# Patient Record
Sex: Female | Born: 1935 | Race: White | Hispanic: No | Smoking: Former smoker
Health system: Southern US, Community
[De-identification: ages and names within clinical notes are randomized; demographics above are authoritative.]

## PROBLEM LIST (undated history)

## (undated) DIAGNOSIS — I10 Essential (primary) hypertension: Secondary | ICD-10-CM

## (undated) DIAGNOSIS — C259 Malignant neoplasm of pancreas, unspecified: Secondary | ICD-10-CM

## (undated) DIAGNOSIS — K219 Gastro-esophageal reflux disease without esophagitis: Secondary | ICD-10-CM

## (undated) DIAGNOSIS — F419 Anxiety disorder, unspecified: Secondary | ICD-10-CM

## (undated) DIAGNOSIS — Z9181 History of falling: Secondary | ICD-10-CM

## (undated) DIAGNOSIS — C801 Malignant (primary) neoplasm, unspecified: Secondary | ICD-10-CM

## (undated) DIAGNOSIS — K900 Celiac disease: Secondary | ICD-10-CM

## (undated) DIAGNOSIS — I82409 Acute embolism and thrombosis of unspecified deep veins of unspecified lower extremity: Secondary | ICD-10-CM

## (undated) HISTORY — DX: Acute embolism and thrombosis of unspecified deep veins of unspecified lower extremity: I82.409

## (undated) HISTORY — PX: INTERSTIM IMPLANT PLACEMENT: SHX5130

## (undated) HISTORY — DX: History of falling: Z91.81

## (undated) HISTORY — DX: Celiac disease: K90.0

## (undated) HISTORY — PX: TUBAL LIGATION: SHX77

## (undated) HISTORY — PX: BACK SURGERY: SHX140

## (undated) HISTORY — PX: OTHER SURGICAL HISTORY: SHX169

## (undated) HISTORY — PX: OVARIAN CYST REMOVAL: SHX89

---

## 1998-04-25 ENCOUNTER — Encounter: Admission: RE | Admit: 1998-04-25 | Discharge: 1998-07-24 | Payer: Self-pay | Admitting: Anesthesiology

## 2000-09-18 ENCOUNTER — Encounter: Admission: RE | Admit: 2000-09-18 | Discharge: 2000-09-18 | Payer: Self-pay | Admitting: Infectious Diseases

## 2000-10-14 ENCOUNTER — Encounter: Admission: RE | Admit: 2000-10-14 | Discharge: 2000-10-14 | Payer: Self-pay | Admitting: Infectious Diseases

## 2002-11-24 ENCOUNTER — Ambulatory Visit (HOSPITAL_BASED_OUTPATIENT_CLINIC_OR_DEPARTMENT_OTHER): Admission: RE | Admit: 2002-11-24 | Discharge: 2002-11-24 | Payer: Self-pay | Admitting: Urology

## 2002-11-24 ENCOUNTER — Encounter: Payer: Self-pay | Admitting: Urology

## 2002-12-04 ENCOUNTER — Ambulatory Visit (HOSPITAL_BASED_OUTPATIENT_CLINIC_OR_DEPARTMENT_OTHER): Admission: RE | Admit: 2002-12-04 | Discharge: 2002-12-04 | Payer: Self-pay | Admitting: Urology

## 2008-08-18 ENCOUNTER — Encounter: Admission: RE | Admit: 2008-08-18 | Discharge: 2008-08-18 | Payer: Self-pay | Admitting: Internal Medicine

## 2010-05-14 ENCOUNTER — Encounter: Payer: Self-pay | Admitting: Internal Medicine

## 2010-06-19 ENCOUNTER — Ambulatory Visit (INDEPENDENT_AMBULATORY_CARE_PROVIDER_SITE_OTHER): Payer: Medicare Other | Admitting: Internal Medicine

## 2010-06-19 DIAGNOSIS — R1310 Dysphagia, unspecified: Secondary | ICD-10-CM

## 2010-06-19 DIAGNOSIS — K219 Gastro-esophageal reflux disease without esophagitis: Secondary | ICD-10-CM

## 2010-06-28 ENCOUNTER — Ambulatory Visit (HOSPITAL_COMMUNITY)
Admission: RE | Admit: 2010-06-28 | Discharge: 2010-06-28 | Disposition: A | Discharge status: Home / Self Care | Payer: Medicare Other | Source: Ambulatory Visit | Attending: Internal Medicine | Admitting: Internal Medicine

## 2010-06-28 ENCOUNTER — Encounter (HOSPITAL_BASED_OUTPATIENT_CLINIC_OR_DEPARTMENT_OTHER): Payer: Medicare Other | Admitting: Internal Medicine

## 2010-06-28 ENCOUNTER — Other Ambulatory Visit (INDEPENDENT_AMBULATORY_CARE_PROVIDER_SITE_OTHER): Payer: Self-pay | Admitting: Internal Medicine

## 2010-06-28 DIAGNOSIS — R1310 Dysphagia, unspecified: Secondary | ICD-10-CM | POA: Insufficient documentation

## 2010-06-28 DIAGNOSIS — K900 Celiac disease: Secondary | ICD-10-CM | POA: Insufficient documentation

## 2010-06-28 DIAGNOSIS — K219 Gastro-esophageal reflux disease without esophagitis: Secondary | ICD-10-CM

## 2010-06-28 DIAGNOSIS — K319 Disease of stomach and duodenum, unspecified: Secondary | ICD-10-CM

## 2010-07-24 NOTE — Op Note (Signed)
  Taylor Alvarez, Taylor Alvarez               ACCOUNT NO.:  [id number]  MEDICAL RECORD NO.:  [id number]           PATIENT TYPE:  O  LOCATION:  DAYP                          FACILITY:  [facility]  PHYSICIAN:  Lionel December, M.D.    DATE OF BIRTH:  [birth year]  DATE OF PROCEDURE:  06/28/2010 DATE OF DISCHARGE:                              OPERATIVE REPORT   PROCEDURE:  Esophagogastroduodenoscopy with esophageal dilation.  INDICATION:  [name] is 77 year old Caucasian female who presents with history of solid food dysphagia, experienced over the last 6 months. She has had few episodes of food impaction relieved with regurgitation. Her last esophageal dilation was 3-1/2 years ago.  Even though no abnormalities were noted to esophagus, she responded to [facility] dilation.  Procedure risks were reviewed with the patient.  Informed consent was obtained.  MEDS FOR CONSCIOUS SEDATION:  Cetacaine spray for oropharyngeal topical anesthesia, Demerol 50 mg IV, Versed 4 mg IV.  FINDINGS:  Procedure performed in endoscopy suite.  The patient's vital signs and O2 sat were monitored during the procedure and remained stable.  The patient was placed in left lateral recumbent position and Pentax videoscope was passed through oropharynx without any difficulty into esophagus.  Esophagus.  Mucosa of the esophagus was normal throughout.  GE junction was located at 36 cm from the incisors and was unremarkable.  No hernia was noted.  Stomach.  It was empty and distended very well by insufflation.  Folds of proximal stomach are normal.  Examination of mucosa at body, antrum, pyloric channel, as well as angularis, fundus, and cardia was normal. Pyloric channel was patent.  Duodenum.  Bulbar mucosa was normal.  Scope was passed into second part of duodenum in the proximal segment.  Lumen was somewhat dilated and there was few small plaques or patches of slightly raised mucosa with abnormal appearance.  This was biopsied  towards the end of the procedure.  Endoscope was withdrawn.  Esophagus was dilated by passing 54-French Maloney dilator to full insertion.  As the dilator was withdrawn, endoscope was passed again and mucosa reexamined and no disruption noted.  Endoscope was passed again to second part of the duodenum and biopsy was taken from these patches of abnormal-appearing postbulbar mucosa. Endoscope was withdrawn.  The patient tolerated the procedure well.  FINAL DIAGNOSES: 1. Normal esophageal exam. 2. Esophagus dilated by passing 54-French Maloney dilator given     history of dysphagia and no mucosal disruption induced. 3. Few small patches of slightly raised mucosa involving the second     part of the duodenum with abnormal appearance.  Biopsy taken for     histology.  RECOMMENDATIONS:  Standard instructions given.  The patient will resume her usual meds.  I will be contacting with results of biopsy and further recommendations if any.     Lionel December, M.D.     NR/MEDQ  D:  06/28/2010  T:  06/28/2010  Job:  [id number]  cc:   Dr. Sherril Croon  Electronically Signed by Lionel December M.D. on 07/24/2010 09:48:05 AM

## 2010-07-24 NOTE — Consult Note (Signed)
NAME:  Taylor Alvarez, Taylor Alvarez               ACCOUNT NO.:  [id number]  MEDICAL RECORD NO.:                     PATIENT TYPE:  LOCATION:                                 FACILITY:  PHYSICIAN:  Lionel December, M.D.    DATE OF BIRTH:  1931-12-26  DATE OF CONSULTATION:  06/19/2010 DATE OF DISCHARGE:                                CONSULTATION   PRESENTING COMPLAINT:  Solid food dysphagia of 6 months' duration.  HISTORY OF PRESENT ILLNESS:  Taylor Alvarez is a 77 year old Caucasian female, patient of Dr. Sherril Croon, who presents with 41-month history of intermittent solid food dysphagia.  She has history of dysphagia.  She had her esophagus dilated back in October 2008.  Even though no structural abnormality was noted to her esophagus, she responded to dilation until about 6 months ago.  She points to suprasternal area as site of bolus obstruction.  She has more trouble with rice, chicken, and breads.  She has had few episodes of food impaction relieved with regurgitation but more often she is able to pass the food bolus distally.  She rarely experiences heartburn while on therapy.  She has a good appetite.  She denies weight loss, melena, or rectal bleeding.  She also denies chronic cough, sore throat, or hoarseness.  She states she has been treated for urinary tract infection multiple times but now on a maintenance therapy with Macrodantin.  CURRENT MEDICATIONS: 1. Vasotec 5 mg p.o. daily. 2. Zoloft 50 mg p.o. daily. 3. Prilosec 20 mg p.o. q.a.m. 4. Centrum Silver 1 daily. 5. Calcium with D 600 daily. 6. Stool softener 1-2 daily p.r.n. 7. MOM p.r.n. which is occasional. 8. Tylenol PM p.r.n. 9. Vitamin D 1000 units daily. 10.Neurontin 300 mg p.o. daily. 11.Imipramine 25 mg daily to start this week. 12.Macrobid 100 mg p.o. at bedtime.  PAST MEDICAL HISTORY:  Chronic GERD of about 13 years' duration.  She had EGD and ED in October 2008 responded to esophageal dilation.  She had small hyperplastic  polyp removed from her gastric body.  Her second dilation was in September 2009 when she also had esophageal biopsy which was negative for eosinophilic esophagitis.  She has been hypertensive for more than 10 years.  She has a history of stress disorder, well controlled with therapy.  The patient's last colonoscopy was in August 2007 revealing mild sigmoid diverticulosis.  She had C-section in 1963, tubal ligation in 1970.  She had lumbar spine surgery for disk disease in 2001 and in January 2008, she had hysterectomy with pelvic floor reconstruction done at [facility].  She has had bilateral cataract surgery, left one was about 6 years ago and right one was done last year.  In 2010, she was also evaluated by speech pathologist and her oropharyngeal deglutition was normal.  History of posthepatic neuralgia and some hearing impairment.  She is on Neurontin for this.  ALLERGIES:  KEFLEX and CARDIZEM.  FAMILY HISTORY:  Mother lived to be 88.  Father died of MI at age 13, he was a physician.  One sister treated for breast carcinoma and is  in remission.  SOCIAL HISTORY:  She is divorced.  She has 3 grown up children.  She is a retired Chartered loss adjuster.  She smoked less than a pack a day for 10 years but quit many years ago.  She drinks wine occasionally.  OBJECTIVE:  VITAL SIGNS:  Weight 156 pounds, she 63 inches tall, pulse 72 per minute, blood pressure 110/68, and temperature is 97.6. HEENT:  Conjunctivae are pink.  Sclerae are nonicteric.  Oropharyngeal mucosa is normal.  Dentition in satisfactory condition. NECK:  No neck masses or thyromegaly noted. CARDIAC:  Regular rhythm.  Normal S1 and S2.  No murmur, rub, or gallop noted. LUNGS:  Clear to auscultation. ABDOMEN:  Symmetrical, soft, and nontender with no organomegaly or masses. EXTREMITIES:  No peripheral edema or clubbing noted.  ASSESSMENT:  Taylor Alvarez is a 77 year old Caucasian female who has chronic gastroesophageal reflux disease  and symptoms are well controlled with antireflux meds and PPI who presents with intermittent solid food dysphagia.  She has had her esophagus dilated twice in the past and on both occasions she responded to therapy.  She has never undergone esophageal manometry or other studies.  Since she has responded well to dilation, we will directly proceed with esophagogastroduodenoscopy and esophageal dilation.  RECOMMENDATIONS:  Esophagogastroduodenoscopy with esophageal dilation. I have reviewed the procedure, risks, and sent options with the patient and she is agreeable to proceed with it.  We appreciate the opportunity to participate in the care of this nice lady.     Lionel December, M.D.     NR/MEDQ  D:  06/19/2010  T:  06/19/2010  Job:  [id number]  cc:   Dr. Sherril Croon  Electronically Signed by Lionel December M.D. on 07/24/2010 09:47:48 AM

## 2010-09-08 NOTE — Op Note (Signed)
NAME:  Taylor Alvarez, Taylor Alvarez                         ACCOUNT NO.:  [id number]   MEDICAL RECORD NO.:  [id number]                   PATIENT TYPE:  AMB   LOCATION:  [facility]                                 FACILITY:  [facility]   PHYSICIAN:  Jamison Neighbor, M.D.               DATE OF BIRTH:  Jul 25, 1931   DATE OF PROCEDURE:  12/04/2002  DATE OF DISCHARGE:                                 OPERATIVE REPORT   PREOPERATIVE DIAGNOSIS:  Infected first-stage InterStim implant.   POSTOPERATIVE DIAGNOSIS:  Infected first-stage InterStim implant.   PROCEDURE:  Removal of first stage InterStim implant.   SURGEON:  Jamison Neighbor, M.D.   ANESTHESIA:  General.   COMPLICATIONS:  None.   DRAIN:  Iodoform gauze.   HISTORY:  This 77 year old female suffers from urgency and frequency  syndrome, unresponsive to anticholinergic therapy.  The patient was tested  with an InterStim implant and had an excellent response to therapy.  The  patient noticed a change in her voiding pattern.  She no longer had any  incontinence, and she felt that she was emptying more efficiently.  The  patient was scheduled to undergo a second-stage implant.  The patient  developed cellulitis in that area within the past 48 hours.  This area was  checked on Wednesday by the parish nurse in her congregation, and it was  felt to be normal.  But by this morning, it was red and beefy.  On  examination, there was clearly cellulitis around the lateral puncture hole  where the connection between the external lead extender and the quadripolar  lead was made.  The patient was advised that this would need to be removed;  the area would have to heal, and then she could eventually go on to have  both first and second-stage implant at the same time since it has been  demonstrated that the patient does respond to this therapy.  The patient  understands the risks and benefits of the procedure and gave full and  informed consent.   DESCRIPTION OF  PROCEDURE:  After the successful induction of general  anesthesia, the patient was placed in the lateral position in order to  prevent her from having to undergo intubation.  She was then prepped and  draped in the usual fashion.  The sutures were taken out of the two  incisions.  The lateral incision was opened, and pus came out.  This was  irrigated out, the connector was then elevated with a right-angle clamp.  The water had been cut off at the skin level, and this easily was pulled  out.  The quadripolar lead itself was easily removed.  There was no need to  open up the paramedian incision, as that area showed no evidence of  cellulitis.  The incision in the pocket inside were irrigated with  antibiotic solution, and then the patient had an Iodoform pack placed.  The  wound was left open.  The patient tolerated the procedure well and was taken  to the recovery room in good condition.  She had taken Cipro postoperatively  because she is allergic to CEPHALOSPORINS but now will be switched to  doxycycline as an  alternative therapy since clearly quinolones did not work well for her.  The  patient will return to the office in follow-up.  She has been told that in  three months if the wounds are all healed, we can place the first and second  stage simultaneously.                                               Jamison Neighbor, M.D.    RJE/MEDQ  D:  12/04/2002  T:  12/04/2002  Job:  [id number]   cc:   Doreen Beam  [address]  [place]  [place] [address]  Fax: [phone]

## 2010-09-08 NOTE — Op Note (Signed)
NAME:  Taylor Alvarez, Taylor Alvarez                         ACCOUNT NO.:  [id number]   MEDICAL RECORD NO.:  [id number]                   PATIENT TYPE:  AMB   LOCATION:  [facility]C                                 FACILITY:  [facility]   PHYSICIAN:  Jamison Neighbor, M.D.               DATE OF BIRTH:  1931-08-22   DATE OF PROCEDURE:  11/24/2002  DATE OF DISCHARGE:                                 OPERATIVE REPORT   PREOPERATIVE DIAGNOSIS:  Urinary incontinence.   POSTOPERATIVE DIAGNOSIS:  Urinary incontinence.   OPERATION/PROCEDURE:  First stage InterStim implantation.   SURGEON:  Jamison Neighbor, M.D.   ANESTHESIA:  IV sedation with local.   COMPLICATIONS:  None.   DRAINS:  16-French Foley catheter, removed in the PACU.   BRIEF HISTORY:  This is a 77 year old female with urinary incontinence  without evidence of stress incontinence.  She has had a poor response to  oral therapy and wishes to see if an InterStim will work as another  alternative.  The patient understands the risks and benefits of this  procedure.  She is aware of the fact that this first-stage implant may or  may not work and it is no guarantee that she will proceed on the permanent  second stage implant.  She gave full and informed consent.   DESCRIPTION OF PROCEDURE:  After successful induction of general anesthesia,  the patient was placed in the prone position.  She had a full 10-minute  scrub and paint.  She received appropriate preoperative antibiotics.  She  was then prepped including a Vi-Drape.  Fluoroscopy was used to identify the  level of third sacral foramen.  The patient had a somewhat irregular-  appearing sacrum and it was a little more difficult than usual to find the  appropriate foramen but after successful placement of a needle into what  turned out to be S4, a second needle was passed through S3.  The patient did  have a bellows effect as well as a great toe dorsiflexion.  It turned out  the nerve was very  superficial to the bone.  The patient had a small  incision made. This was all done using local anesthetic.  The dilator system  was used to dilate a tract to the small incision down to the foramen and  through the foramen.  The tined lead was then passed down through the trocar  and positioned so that there was an excellent bellows effect on lead 3, 2,  and 1, with the best effect on lead 2.  It was felt that this was so  superficial that we could not bring this up any higher for fear that there  would not be good deployment of the tines.  The lead was capped in position  and we tested after the trocar system was removed and it had not moved and  there was still a good effect.  The  patient could feel this in the vaginal  and rectal area.  A small incision was then made in the top of the right  buttock, and the quadripolar lead was passed over to that area.  Using the  tissue passer, the lead extender was then brought from that small incision  out through a stab wound in the top of the left buttock.  The connection  made in the usual fashion and the bootie was tied down with silk sutures.  Antibiotic irrigation was used to in both incisions.  The incision was  then closed with nylon sutures.  The patient tolerated the procedure well  and was taken to the recovery room in good condition.   She will be given a prescription for Keflex as well as Lorcet-Plus as well  as return to see me in followup.                                                Jamison Neighbor, M.D.    RJE/MEDQ  D:  11/24/2002  T:  11/24/2002  Job:  [id number]   cc:   Doreen Beam  [address]  [place]  [place] [address]  Fax: [phone]

## 2010-10-24 ENCOUNTER — Ambulatory Visit (INDEPENDENT_AMBULATORY_CARE_PROVIDER_SITE_OTHER): Payer: Medicare Other | Admitting: Internal Medicine

## 2010-10-24 DIAGNOSIS — K5900 Constipation, unspecified: Secondary | ICD-10-CM

## 2010-10-24 DIAGNOSIS — K900 Celiac disease: Secondary | ICD-10-CM

## 2011-01-11 ENCOUNTER — Ambulatory Visit (INDEPENDENT_AMBULATORY_CARE_PROVIDER_SITE_OTHER): Payer: Medicare Other | Admitting: Internal Medicine

## 2011-01-25 ENCOUNTER — Encounter (INDEPENDENT_AMBULATORY_CARE_PROVIDER_SITE_OTHER): Payer: Self-pay | Admitting: Internal Medicine

## 2011-01-25 ENCOUNTER — Ambulatory Visit (INDEPENDENT_AMBULATORY_CARE_PROVIDER_SITE_OTHER): Payer: Medicare Other | Admitting: Internal Medicine

## 2011-01-25 DIAGNOSIS — K5900 Constipation, unspecified: Secondary | ICD-10-CM

## 2011-01-25 NOTE — Progress Notes (Signed)
Subjective:     Patient ID: TASFIA VASSEUR, female   DOB: August 13, 1931, 77 y.o.   MRN: [id number]  HPI Dalissa is a 77 yr old female presenting today with c/o constipation.  She was last seen in the office in July.  She underwent and EGD in March for dysphagia. A biopsy revealed celiac disease.  She is on a gluten free diet since April.  Today she says her stools are thick and slick.  She says her stools get stuck inside of her.  She is pushing to have a BM.  She took an enema this week and it helped.  She has a stool about every 3 days. She has been drink prune juice and MOM.  This worked but then she had diarrhea.  She has had these symptoms for many months.     Her last colonoscopy was 5 yrs ago and was normal. She is actually going to be scheduled for a colonocopy soonwith Dr. Karilyn Cota.   Review of Systems see hpi Current Outpatient Prescriptions  Medication Sig Dispense Refill  . calcium carbonate (OS-CAL) 600 MG TABS Take 600 mg by mouth 2 (two) times daily with a meal.        . cholecalciferol (VITAMIN D) 1000 UNITS tablet Take 1,000 Units by mouth daily.        . enalapril (VASOTEC) 5 MG tablet Take 5 mg by mouth daily.        Marland Kitchen gabapentin (NEURONTIN) 300 MG capsule Take 300 mg by mouth daily.        Marland Kitchen ibuprofen (ADVIL,MOTRIN) 200 MG tablet Take 200 mg by mouth every 6 (six) hours as needed.        . multivitamin-iron-minerals-folic acid (CENTRUM) chewable tablet Chew 1 tablet by mouth daily.        Marland Kitchen omeprazole (PRILOSEC) 20 MG capsule Take 20 mg by mouth 2 (two) times daily before a meal.        . polyethylene glycol (MIRALAX / GLYCOLAX) packet Take 17 g by mouth as needed.       . sertraline (ZOLOFT) 50 MG tablet Take 50 mg by mouth daily.             Objective:   Physical Exam    Filed Vitals:   01/25/11 1621  BP: 90/58  Pulse: 65  Temp: 98.2 F (36.8 C)  Height: 5\' 3"  (1.6 m)  Weight: 149 lb 12.8 oz (67.949 kg)       Alert and oriented. Skin warm and dry. Oral mucosa is  moist. Natural teeth in good condition. Sclera anicteric, conjunctivae is pink. Thyroid not enlarged. No cervical lymphadenopathy. Lungs clear. Heart regular rate and rhythm.  Abdomen is soft. Bowel sounds are positive. No hepatomegaly. No abdominal masses felt. No tenderness.  No edema to lower extremities. Patient is alert and oriented.  Assessment:    Constipation since being diagnosed with Celiac disease.   Plan:    Will try Amitiza samples and see how she does.  She will have OV in 4 weeks.  She will call with a progress report in 2 weeks. She is satisfied with the care she received today.

## 2011-01-29 ENCOUNTER — Encounter (INDEPENDENT_AMBULATORY_CARE_PROVIDER_SITE_OTHER): Payer: Self-pay | Admitting: *Deleted

## 2011-01-31 ENCOUNTER — Encounter (INDEPENDENT_AMBULATORY_CARE_PROVIDER_SITE_OTHER): Payer: Self-pay | Admitting: *Deleted

## 2011-01-31 ENCOUNTER — Telehealth (INDEPENDENT_AMBULATORY_CARE_PROVIDER_SITE_OTHER): Payer: Self-pay | Admitting: *Deleted

## 2011-01-31 ENCOUNTER — Other Ambulatory Visit (INDEPENDENT_AMBULATORY_CARE_PROVIDER_SITE_OTHER): Payer: Self-pay | Admitting: *Deleted

## 2011-01-31 DIAGNOSIS — R194 Change in bowel habit: Secondary | ICD-10-CM

## 2011-01-31 MED ORDER — PEG-KCL-NACL-NASULF-NA ASC-C 100 G PO SOLR: 1.0000 | Freq: Once | ORAL | Status: DC

## 2011-01-31 NOTE — Telephone Encounter (Signed)
Patient need movi prep 

## 2011-03-05 ENCOUNTER — Ambulatory Visit (INDEPENDENT_AMBULATORY_CARE_PROVIDER_SITE_OTHER): Payer: Medicare Other | Admitting: Internal Medicine

## 2011-03-05 ENCOUNTER — Encounter (INDEPENDENT_AMBULATORY_CARE_PROVIDER_SITE_OTHER): Payer: Self-pay | Admitting: *Deleted

## 2011-03-05 ENCOUNTER — Encounter (INDEPENDENT_AMBULATORY_CARE_PROVIDER_SITE_OTHER): Payer: Self-pay | Admitting: Internal Medicine

## 2011-03-05 DIAGNOSIS — K5900 Constipation, unspecified: Secondary | ICD-10-CM

## 2011-03-05 DIAGNOSIS — I10 Essential (primary) hypertension: Secondary | ICD-10-CM

## 2011-03-05 NOTE — Patient Instructions (Signed)
Follow up pending colonoscopy 

## 2011-03-05 NOTE — Progress Notes (Signed)
Subjective:     Patient ID: Taylor Alvarez, female   DOB: 12/19/1931, 77 y.o.   MRN: [id number]  HPI Taylor Alvarez presents today for f/u of her constipation.  She had been constipated since March.  She tried Amitiza and developed diarrhea. She stopped the Amitiza after the first dose.  She is now taking Fiber  Pills.   She is now having a BM every other day.  Her last colonoscopy was 5 yrs ago and it was normal.   EGD in March for dysphagia revealed Celiac disease. She is following a gluten free diet. She says she is about 80% better as far as her constipation is concerned.  Appetite is good.  No weight loss. No melena or bright red rectal bleeding.       Review of Systems see hpi      Objective:   Physical Exam  Filed Vitals:   03/05/11 1426  BP: 90/64  Temp: 97.9 F (36.6 C)  Height: 5\' 3"  (1.6 m)  Weight: 153 lb (69.4 kg)   Past Surgical History  Procedure Date  . Urge incontinence   . Hypertension   . Back surgery   . Partial hysterectomy with bladder tac   . Bilateral cataract surgery   . Ovarian cyst removal   . Tubal ligation   . Cesarean section    Past Medical History  Diagnosis Date  . Celiac disease    History reviewed. No pertinent family history. History   Social History Narrative  . No narrative on file   History   Social History  . Marital Status: Divorced    Spouse Name: N/A    Number of Children: N/A  . Years of Education: N/A   Occupational History  . Not on file.   Social History Main Topics  . Smoking status: Never Smoker   . Smokeless tobacco: Not on file  . Alcohol Use: No  . Drug Use: No  . Sexually Active: Not on file   Other Topics Concern  . Not on file   Social History Narrative  . No narrative on file   Allergies  Allergen Reactions  . Cardizem   . Keflex     Alert and oriented. Skin warm and dry. Oral mucosa is moist. Natural teeth in good condition. Sclera anicteric, conjunctivae is pink. Thyroid not enlarged. No  cervical lymphadenopathy. Lungs clear. Heart regular rate and rhythm.  Abdomen is soft. Bowel sounds are positive. No hepatomegaly. No abdominal masses felt. No tenderness.  No edema to lower extremities. Patient is alert and oriented.      Assessment:     Constipation which is much better.   Plan:    Continue fiber medication. Increase fluids. OV after she has her colonoscopy

## 2011-03-06 ENCOUNTER — Encounter (INDEPENDENT_AMBULATORY_CARE_PROVIDER_SITE_OTHER): Payer: Self-pay | Admitting: Internal Medicine

## 2011-05-01 ENCOUNTER — Telehealth (INDEPENDENT_AMBULATORY_CARE_PROVIDER_SITE_OTHER): Payer: Self-pay | Admitting: *Deleted

## 2011-05-01 ENCOUNTER — Encounter (INDEPENDENT_AMBULATORY_CARE_PROVIDER_SITE_OTHER): Payer: Self-pay | Admitting: *Deleted

## 2011-05-01 MED ORDER — SODIUM CHLORIDE 0.45 % IV SOLN
Freq: Once | INTRAVENOUS | Status: AC
  Administered 2011-06-06: 08:00:00 via INTRAVENOUS

## 2011-05-01 NOTE — Telephone Encounter (Signed)
lmom advising patient her to be at [facility] at 630 day of procedure

## 2011-05-01 NOTE — Telephone Encounter (Signed)
Please give her a call with date and time her tcs is . She will not be home to please leave a message. The return phone number is [phone].

## 2011-05-28 ENCOUNTER — Encounter (HOSPITAL_COMMUNITY): Payer: Self-pay | Admitting: Pharmacy Technician

## 2011-06-01 ENCOUNTER — Telehealth (INDEPENDENT_AMBULATORY_CARE_PROVIDER_SITE_OTHER): Payer: Self-pay | Admitting: *Deleted

## 2011-06-01 NOTE — Telephone Encounter (Signed)
LM stating she has received her letter in reference to the tcs scheduled for 06/06/11. It said if you have swallowing problems to not use. [name] had her throat stretched last year. Should she drink this? The return phone number is [phone].

## 2011-06-01 NOTE — Telephone Encounter (Signed)
Advised patient she should be fine with this, Dr Karilyn Cota does TCS/EGD on patients all the time and they have to drink solution to be cleaned out for TCS part of test

## 2011-06-06 ENCOUNTER — Ambulatory Visit (HOSPITAL_COMMUNITY)
Admission: RE | Admit: 2011-06-06 | Discharge: 2011-06-06 | Disposition: A | Discharge status: Home / Self Care | Payer: Medicare Other | Source: Ambulatory Visit | Attending: Internal Medicine | Admitting: Internal Medicine

## 2011-06-06 ENCOUNTER — Encounter (HOSPITAL_COMMUNITY)
Admission: RE | Disposition: A | Discharge status: Home / Self Care | Payer: Self-pay | Source: Ambulatory Visit | Attending: Internal Medicine

## 2011-06-06 ENCOUNTER — Other Ambulatory Visit (INDEPENDENT_AMBULATORY_CARE_PROVIDER_SITE_OTHER): Payer: Self-pay | Admitting: Internal Medicine

## 2011-06-06 ENCOUNTER — Encounter (HOSPITAL_COMMUNITY): Payer: Self-pay

## 2011-06-06 DIAGNOSIS — K5730 Diverticulosis of large intestine without perforation or abscess without bleeding: Secondary | ICD-10-CM

## 2011-06-06 DIAGNOSIS — K6389 Other specified diseases of intestine: Secondary | ICD-10-CM

## 2011-06-06 DIAGNOSIS — K5909 Other constipation: Secondary | ICD-10-CM | POA: Insufficient documentation

## 2011-06-06 DIAGNOSIS — I10 Essential (primary) hypertension: Secondary | ICD-10-CM | POA: Insufficient documentation

## 2011-06-06 DIAGNOSIS — K5520 Angiodysplasia of colon without hemorrhage: Secondary | ICD-10-CM | POA: Insufficient documentation

## 2011-06-06 DIAGNOSIS — R198 Other specified symptoms and signs involving the digestive system and abdomen: Secondary | ICD-10-CM

## 2011-06-06 DIAGNOSIS — R1032 Left lower quadrant pain: Secondary | ICD-10-CM | POA: Insufficient documentation

## 2011-06-06 DIAGNOSIS — Z79899 Other long term (current) drug therapy: Secondary | ICD-10-CM | POA: Insufficient documentation

## 2011-06-06 DIAGNOSIS — D126 Benign neoplasm of colon, unspecified: Secondary | ICD-10-CM | POA: Insufficient documentation

## 2011-06-06 DIAGNOSIS — R194 Change in bowel habit: Secondary | ICD-10-CM

## 2011-06-06 HISTORY — PX: COLONOSCOPY: SHX5424

## 2011-06-06 HISTORY — DX: Essential (primary) hypertension: I10

## 2011-06-06 HISTORY — DX: Anxiety disorder, unspecified: F41.9

## 2011-06-06 HISTORY — DX: Gastro-esophageal reflux disease without esophagitis: K21.9

## 2011-06-06 SURGERY — COLONOSCOPY
Anesthesia: Moderate Sedation

## 2011-06-06 MED ORDER — MIDAZOLAM HCL 5 MG/5ML IJ SOLN
INTRAMUSCULAR | Status: DC | PRN
  Administered 2011-06-06 (×2): 2 mg via INTRAVENOUS

## 2011-06-06 MED ORDER — MEPERIDINE HCL 50 MG/ML IJ SOLN
INTRAMUSCULAR | Status: AC
  Filled 2011-06-06: qty 1

## 2011-06-06 MED ORDER — MIDAZOLAM HCL 5 MG/5ML IJ SOLN
INTRAMUSCULAR | Status: AC
  Filled 2011-06-06: qty 10

## 2011-06-06 MED ORDER — DOCUSATE SODIUM 100 MG PO CAPS: 200.0000 mg | ORAL_CAPSULE | Freq: Every day | ORAL | Status: AC

## 2011-06-06 MED ORDER — MEPERIDINE HCL 50 MG/ML IJ SOLN
INTRAMUSCULAR | Status: DC | PRN
  Administered 2011-06-06: 25 mg via INTRAVENOUS

## 2011-06-06 MED ORDER — STERILE WATER FOR IRRIGATION IR SOLN
Status: DC | PRN
  Administered 2011-06-06: 08:00:00

## 2011-06-06 NOTE — Discharge Instructions (Addendum)
Resume usual medications. High fiber diet. Continue fiber pills as before. Colace 2 tablets  by mouth daily at bedtime. No driving for 24 hours. Physician will contact you with the biopsy results.High Fiber Diet A high fiber diet changes your normal diet to include more whole grains, legumes, fruits, and vegetables. Changes in the diet involve replacing refined carbohydrates with unrefined foods. The calorie level of the diet is essentially unchanged. The Dietary Reference Intake (recommended amount) for adult males is 38 g per day. For adult females, it is 25 g per day. Pregnant and lactating women should consume 28 g of fiber per day. Fiber is the intact part of a plant that is not broken down during digestion. Functional fiber is fiber that has been isolated from the plant to provide a beneficial effect in the body. PURPOSE  Increase stool bulk.   Ease and regulate bowel movements.   Lower cholesterol.  INDICATIONS THAT YOU NEED MORE FIBER  Constipation and hemorrhoids.   Uncomplicated diverticulosis (intestine condition) and irritable bowel syndrome.   Weight management.   As a protective measure against hardening of the arteries (atherosclerosis), diabetes, and cancer.  NOTE OF CAUTION If you have a digestive or bowel problem, ask your caregiver for advice before adding high fiber foods to your diet. Some of the following medical problems are such that a high fiber diet should not be used without consulting your caregiver:  Acute diverticulitis (intestine infection).   Partial small bowel obstructions.   Complicated diverticular disease involving bleeding, rupture (perforation), or abscess (boil, furuncle).   Presence of autonomic neuropathy (nerve damage) or gastric paresis (stomach cannot empty itself).  GUIDELINES FOR INCREASING FIBER Replace refined and processed grains with whole grains, canned fruits with fresh fruits, and incorporate other fiber sources. White rice,  white breads, and o are sensitive to gluten. A tissue sample (biopsy) of the small intestine is usually required for a positive diagnosis of gluten sensitivity. Dietary treatment consists of eliminating foods and food ingredients from wheat, rye, and barley. When these are excluded completely from the diet, most patients regain function of the small intestine. Strict compliance is important even during symptom-free periods. Gluten sensitive patients must realize that this is a lifelong diet. During the first stages of treatment, some people will also need to restrict dairy products that contain lactose, which is a naturally occurring sugar. Lactose is difficult to absorb when the small intestines are damaged. This is called lactose intolerance. WHY YOU NEED THIS DIET Ask your caregiver to explain the conditions that you may have.  Celiac disease / nontropical sprue / gluten-sensitive enteropathy.   Dermatitis herpetiformis.  SPECIAL NOTES  Gluten from wheat, rye, and barley protein interferes with absorbing food in individuals with gluten sensitivity. It is important to read all labels, as gluten may have been added as an incidental ingredient. Words to check for on the label include: flour, starch, durum flour, graham flour, phosphated flour, self-rising flour, semolina, farina, modified food starch, cereal, thickening, fillers, emulsifiers, malt flavoring, hydrolyzed vegetable protein. A Registered Dietician can help you identify possible harmful ingredients in the foods you normally eat.   If you are not sure whether an ingredient contains gluten, be sure to check with the manufacturer. Note that some manufacturers may change ingredients without notice. Always read labels.  Since flour and cereal products are quite often used in the preparation of foods, it is important to be aware of the methods of preparation used, as well as the foods  themselves. This is especially true when you are dining  out. Starches  Allowed: Only those prepared from arrowroot, corn, potato, rice, and bean flours. Rice wafers (*); pure cornmeal tortillas; popcorn; some crackers and chips(*). Hot cereals made from cornmeal; Cream of Rice. Cold cereals such as puffed rice, Kellogg's Sugar Pops, Post's Fruity & Chocolate Pebbles, [employer] and [employer], Featherweight's [employer]; [employer] Cocoa Puffs, and Gluten-free Oatmeal. White or sweet potatoes; yams; hominy; rice or wild rice; special gluten-free pasta. Some oriental rice noodles or bean noodles.   Avoid: All wheat, and rye cereals; wheat germ, barley, bran, graham, malt, bulgur, and millet (-). NOTE: Avoid cereals containing malt as a flavoring such as Rice Krispies. Regular noodles, spaghetti, macaroni, most packaged rice mixes(*). All others containing wheat, rye, and/or barley.  Vegetables  Allowed: All plain, fresh, frozen, or canned vegetables.   Avoid: Creamed vegetables(*), vegetables canned in sauces(*). Any prepared with wheat, rye, or barley.  Fruit  Allowed: All fresh, frozen, canned, or dried fruits. Fruit juices.   Avoid: Thickened or prepared fruits; some pie fillings(*).  Meat and Meat Substitutes  Allowed: Meat, fish, poultry, or eggs prepared without added wheat, rye, or barley; luncheon meat(*), frankfurters(*), and pure meat. All aged cheese, processed cheese products(*). Cottage cheese(+), cream cheese(+). Dried beans and peas; lentils.   Avoid: Any meat or meat alternate containing wheat, rye, barley, or gluten stabilizers; Bread-containing products such as Swiss steak, croquettes, and meatloaf. Tuna canned in vegetable broth(*); [place] with HVP injected as part of the basting; any cheese product containing oat gum as an ingredient.  Milk  Allowed: Milk. Yogurt made with allowed ingredients(*).   Avoid: Commercial chocolate milk which may have cereal added(*). Malted milk.  Soups and Combination  Foods  Allowed: Homemade broth and soups made with allowed ingredients; some canned or frozen soups are allowed(*). Combination or prepared foods that do not contain gluten(*). Read labels.   Avoid: All soups containing wheat, rye, or barley flour. Bouillon and bouillon cubes that contain hydrolyzed vegetable protein (HVP). Combination or prepared foods that do contain gluten(*).  Desserts  Allowed: Custard, junket, homemade puddings from cornstarch, rice, and tapioca; some pudding mixes(*). Gelatin desserts, ices, and sherbet(*). Cake, cookies, and other desserts prepared with allowed flours.Some commercial ice creams(*).   Avoid: Cakes, cookies, doughnuts, pastries, etc., prepared with wheat, rye, and/or barley flour. Some commercial ice creams(*), ice cream flavors which contain cookies, crumbs, or cheesecake(*); ice cream cones. All commercially prepared mixes for cakes, cookies, and other desserts(*); bread pudding; puddings thickened with flour.  Sweets  Allowed: Sugar, honey, syrup(*), molasses, jelly, jam, plain hard candy, marshmallows, gumdrops, homemade candies free from wheat, rye, or barley. Coconut.   Avoid: Commercial candies containing wheat, rye, or barley(*). [place] is dusted with wheat flour. Chocolate-coated nuts, which are often rolled in flour.  Fats and Oils  Allowed: Butter, margarine, vegetable oil, sour cream(+), whipping cream, shortening, lard, cream, mayonnaise(*). Some commercial salad dressings(*). Peanut butter.   Avoid: Some commercial salad dressings(*).  Beverages  Allowed: Coffee (regular or decaffeinated), tea, herbal tea (read label to be sure that no wheat flour has been added). Carbonated beverages; some root beers(*).   Avoid: Cereal beverages such as Postum or Ovaltine ; beer (unless Gluten free), ale, malted milk; some root beers, wine, and sake.  Condiments  Allowed: Salt, pepper, herbs, spices, extracts, food colorings; monosodium  glutamate (MSG); cider, rice, and wine vinegar; bicarbonate of soda; baking powder;  Chun King soy sauce; nuts, coconut, chocolate, and pure cocoa powder.   Avoid: Some curry powder (*), some dry seasoning mixes (*), some gravy extracts (*), some meat sauces (*), some catsup (*), some prepared mustard (*), horseradish (*), some soy sauce (*), chip dips (*), some chewing gum (*). Yeast extract (contains barley). Caramel color (may contain malt).  Flour and thickening agents Allowed: Arrowroot starch (A), Corn bran (B), Corn flour (B,C,D), Corn germ (B), Cornmeal (B,C,D), Corn starch (A), Potato flour (B,C,E), Potato starch flour (B,C,E), Rice bran (B), Rice flours: Plain, brown (B,C,D,E) Sweet (A,B,C,F). Rice polish (B,C,G), Soy flour (B,C,G), Tapioca starch (A). ARE GOOD FOR: (A) Good thickening agent (B) Good when combined with other flours (C) Best combined with milk and eggs in baked products (D) Best in grainy-textured products (E) Produces drier product than other flours (F) Produces moister product than other flours (G) Adds distinct flavor to product; use in moderation. (*) Check labels and investigate any questionable ingredients.  (-) Additional research is needed before this product can be recommended. (+) Check vegetable gum used. * These amounts indicate the minimum number of servings needed from the basic food groups to provide a variety of nutrients essential to good health. The word "maximum" is used if amounts of certain foods eaten must be controlled. Combination foods may count as full or partial servings from the food groups. Dark green, leafy, or orange vegetables are recommended 3 or 4 times weekly to provide vitamin A. A good source of vitamin C is recommended daily.  NOTE: Brand names are used for clarification only, and do not constitute an endorsement. Also, ingredients may be changed by the manufacturer without notice. Always read labels. SAMPLE MEAL PLAN Breakfast    Fruit or juice.   Cereal (from allowed grains).   Toast (from allowed grains).   Heart-healthy tub margarine   Jam or jelly.   Milk beverage.  Lunch  Meat or meat substitute.   Gluten-free bread.   Vegetable or salad.   Heart-healthy margarine.   Fruit or dessert.   Milk beverage.  Dinner  Meat or meat substitute.   Potato or rice.   Salad with dressing or soup.   Gluten-free bread.   Vegetable.   Fruit or dessert.   Heart-healthy margarine.   Beverage.  SAMPLE MENU Breakfast   Orange juice.   Banana.   Rice or corn cereal.   Toast (gluten-free bread).   Heart-healthy tub margarine.   Jam.   Milk.   Coffee or tea.  Lunch  Chicken salad sandwich (with gluten-free bread and mayonnaise).   Sliced tomatoes.   Heart-healthy tub margarine.   Apple.   Milk.   Coffee or tea.  Dinner  [place].   Baked potato.   Broccoli.   Lettuce salad with gluten-free dressing.   Gluten-free bread.   Custard.   Heart-healthy margarine.   Coffee or tea.  These meal plans are provided as samples. Your daily meal plans will vary. Document Released: 04/09/2005 Document Revised: 12/20/2010 Document Reviewed: 08/23/2008 [facility] Patient Information 2012 [place], [place].

## 2011-06-06 NOTE — Op Note (Signed)
COLONOSCOPY PROCEDURE REPORT  PATIENT:  Taylor Alvarez  MR#:  [id number] Birthdate:  Oct 23, 1931, 78 y.o., female Endoscopist:  Dr. Malissa Hippo, MD Referred By:  Dr. Ignatius Specking, MD Procedure Date: 06/06/2011  Procedure:   Colonoscopy  Indications:  Patient is a 78 year old Caucasian female who has developed change in her bowel habits with constipation and intermittent left-sided abdominal pain. She is undergoing diagnostic colonoscopy. Informed Consent:  Procedure and risks reviewed with the patient and informed consent was obtained.  Medications:  Demerol 25 mg IV Versed 4 mg IV  Description of procedure:  After a digital rectal exam was performed, that colonoscope was advanced from the anus through the rectum and colon to the area of the cecum, ileocecal valve and appendiceal orifice. The cecum was deeply intubated. These structures were well-seen and photographed for the record. From the level of the cecum and ileocecal valve, the scope was slowly and cautiously withdrawn. The mucosal surfaces were carefully surveyed utilizing scope tip to flexion to facilitate fold flattening as needed. The scope was pulled down into the rectum where a thorough exam including retroflexion was performed.  Findings:   Prep satisfactory. Few scattered diverticula throughout the colon. Three nonbleeding cecal AV malformations. 5 mm polyp ablated via cold biopsy from distal transverse colon. Single small anal papilla.  Therapeutic/Diagnostic Maneuvers Performed:  See above  Complications:  None  Cecal Withdrawal Time:  14 minutes  Impression:  Examination performed to cecum. Three cecal AV malformations which were nonbleeding and left alone.  5 mm polyp ablated via cold biopsy from distal transverse colon. Small anal papilla.  Recommendations:  Standard instructions given. High fiber diet. Colace 2 tablets daily at bedtime. I will be contacting patient with the results of biopsy. We'll  review records from [facility] to determine if she's had recent abdominal imaging otherwise may need one.  Marcellis Frampton U  06/06/2011 9:01 AM  CC: Dr. Ignatius Specking., MD, MD & Dr. Bonnetta Barry ref. provider found

## 2011-06-06 NOTE — H&P (Signed)
Taylor Alvarez is an 78 y.o. female.   Chief Complaint: Patient is here for colonoscopy HPI: Patient is a 78 year old Caucasian female whose recent or change in bowel habits with constipation. She also complains of intermittent pain in the for abdomen. She denies rectal bleeding anorexia weight loss. Patient's last colonoscopy was over 5 years ago at [facility] in [place]. Family history is negative for colorectal carcinoma.  Past Medical History  Diagnosis Date  . Celiac disease   . GERD (gastroesophageal reflux disease)   . Hypertension   . Anxiety     Past Surgical History  Procedure Date  . Urge incontinence   . Hypertension   . Back surgery   . Partial hysterectomy with bladder tac   . Bilateral cataract surgery   . Ovarian cyst removal   . Tubal ligation   . Cesarean section     Family History  Problem Relation Age of Onset  . Anesthesia problems Neg Hx   . Hypotension Neg Hx   . Malignant hyperthermia Neg Hx   . Pseudochol deficiency Neg Hx    Social History:  reports that she quit smoking about 58 years ago. Her smoking use included Cigarettes. She has a 2.5 pack-year smoking history. She does not have any smokeless tobacco history on file. She reports that she does not drink alcohol or use illicit drugs.  Allergies:  Allergies  Allergen Reactions  . Keflex Diarrhea and Nausea And Vomiting  . Cardizem (Diltiazem Hcl) Rash    On face    Medications Prior to Admission  Medication Dose Route Frequency Provider Last Rate Last Dose  . 0.45 % sodium chloride infusion   Intravenous Once Malissa Hippo, MD 20 mL/hr at 06/06/11 0752    . meperidine (DEMEROL) 50 MG/ML injection           . midazolam (VERSED) 5 MG/5ML injection           . simethicone susp in sterile water 1000 mL irrigation    PRN Malissa Hippo, MD       Medications Prior to Admission  Medication Sig Dispense Refill  . acetaminophen (TYLENOL) 500 MG tablet Take 500 mg by mouth every 6 (six) hours  as needed. For pain      . calcium carbonate (OS-CAL) 600 MG TABS Take 600 mg by mouth daily.       . cholecalciferol (VITAMIN D) 1000 UNITS tablet Take 1,000 Units by mouth daily.        . enalapril (VASOTEC) 5 MG tablet Take 5 mg by mouth daily.        Marland Kitchen gabapentin (NEURONTIN) 300 MG capsule Take 300 mg by mouth at bedtime.       . multivitamin-iron-minerals-folic acid (CENTRUM) chewable tablet Chew 1 tablet by mouth daily.        Marland Kitchen omeprazole (PRILOSEC) 20 MG capsule Take 20 mg by mouth daily.       . sertraline (ZOLOFT) 50 MG tablet Take 50 mg by mouth daily.        Marland Kitchen ibuprofen (ADVIL,MOTRIN) 200 MG tablet Take 200 mg by mouth every 6 (six) hours as needed. For pain        No results found for this or any previous visit (from the past 48 hour(s)). No results found.  Review of Systems  Constitutional: Negative for weight loss.  Gastrointestinal: Negative for abdominal pain, diarrhea, blood in stool and melena.    Blood pressure 151/74, pulse 79, temperature 98.4  F (36.9 C), temperature source Oral, resp. rate 13, height 5\' 3"  (1.6 m), weight 150 lb (68.04 kg), SpO2 99.00%. Physical Exam  Constitutional: She is oriented to person, place, and time. She appears well-developed and well-nourished.  HENT:  Mouth/Throat: Oropharynx is clear and moist.  Eyes: Conjunctivae are normal. No scleral icterus.  Neck: No thyromegaly present.  GI: She exhibits no distension.       Abdomen soft with mild tenderness and acute and fullness in right lower quadrant on deep palpation.  Musculoskeletal: She exhibits no edema.  Lymphadenopathy:    She has no cervical adenopathy.  Neurological: She is alert and oriented to person, place, and time.  Skin: Skin is warm and dry.     Assessment/Plan Change in bowel habits. Diagnostic colonoscopy.  Narvel Kozub U 06/06/2011, 8:28 AM

## 2011-06-12 ENCOUNTER — Encounter (HOSPITAL_COMMUNITY): Payer: Self-pay | Admitting: Internal Medicine

## 2011-06-13 ENCOUNTER — Encounter (INDEPENDENT_AMBULATORY_CARE_PROVIDER_SITE_OTHER): Payer: Self-pay | Admitting: *Deleted

## 2011-11-08 ENCOUNTER — Telehealth (INDEPENDENT_AMBULATORY_CARE_PROVIDER_SITE_OTHER): Payer: Self-pay | Admitting: *Deleted

## 2011-11-08 NOTE — Telephone Encounter (Signed)
When should Taylor Alvarez return for a f/u visit?

## 2011-11-09 NOTE — Telephone Encounter (Signed)
Message left at home for her to call for an appt.

## 2011-11-13 NOTE — Telephone Encounter (Signed)
Apt has been scheduled for 11/27/11 at 10:30 am with Dorene Ar, NP

## 2011-11-27 ENCOUNTER — Ambulatory Visit (INDEPENDENT_AMBULATORY_CARE_PROVIDER_SITE_OTHER): Payer: Medicare Other | Admitting: Internal Medicine

## 2011-11-27 ENCOUNTER — Encounter (INDEPENDENT_AMBULATORY_CARE_PROVIDER_SITE_OTHER): Payer: Self-pay | Admitting: Internal Medicine

## 2011-11-27 VITALS — BP 96/60 | HR 60 | Temp 98.2°F | Ht 64.0 in | Wt 144.4 lb

## 2011-11-27 DIAGNOSIS — K900 Celiac disease: Secondary | ICD-10-CM | POA: Insufficient documentation

## 2011-11-27 NOTE — Progress Notes (Signed)
Subjective:     Patient ID: Taylor Alvarez, female   DOB: 1932-03-03, 78 y.o.   MRN: [id number]  HPIHere today for f/u. She says she is doing good.  Her constipation is better.  She is taking OTC stool softner with a small amt of laxative. She has a BM about every other. Normal caliber (large EGD in March 2012 for dysphagia which revealed Celiac disease. She is on a Gluten free diet. Appetite is good. No weight loss.  She is taking a Probiotic.   06/06/2011 Colonoscopy: Impression:  Examination performed to cecum.  Three cecal AV malformations which were nonbleeding and left alone.  5 mm polyp ablated via cold biopsy from distal transverse colon.  Small anal papilla.  Biopsy: Adenoma.   Review of Systems Current Outpatient Prescriptions  Medication Sig Dispense Refill  . acetaminophen (TYLENOL) 500 MG tablet Take 500 mg by mouth every 6 (six) hours as needed. For pain      . calcium carbonate (OS-CAL) 600 MG TABS Take 600 mg by mouth daily.       . cholecalciferol (VITAMIN D) 1000 UNITS tablet Take 1,000 Units by mouth daily.        . enalapril (VASOTEC) 5 MG tablet Take 5 mg by mouth daily.        Marland Kitchen gabapentin (NEURONTIN) 300 MG capsule Take 300 mg by mouth at bedtime.       Marland Kitchen ibuprofen (ADVIL,MOTRIN) 200 MG tablet Take 200 mg by mouth every 6 (six) hours as needed. For pain      . Lactobacillus (FLORAJEN ACIDOPHILUS) CAPS Take 1 capsule by mouth daily.       . multivitamin-iron-minerals-folic acid (CENTRUM) chewable tablet Chew 1 tablet by mouth daily.        Marland Kitchen omeprazole (PRILOSEC) 20 MG capsule Take 20 mg by mouth daily.       . polycarbophil (FIBERCON) 625 MG tablet Take 625 mg by mouth daily.        . sertraline (ZOLOFT) 50 MG tablet Take 50 mg by mouth daily.         Past Medical History  Diagnosis Date  . Celiac disease   . GERD (gastroesophageal reflux disease)   . Hypertension   . Anxiety    Past Surgical History  Procedure Date  . Urge incontinence   . Hypertension     . Back surgery   . Partial hysterectomy with bladder tac   . Bilateral cataract surgery   . Ovarian cyst removal   . Tubal ligation   . Cesarean section   . Colonoscopy 06/06/2011    Procedure: COLONOSCOPY;  Surgeon: Malissa Hippo, MD;  Location: [facility];  Service: Endoscopy;  Laterality: N/A;   History   Social History  . Marital Status: Divorced    Spouse Name: N/A    Number of Children: N/A  . Years of Education: N/A   Occupational History  . Not on file.   Social History Main Topics  . Smoking status: Former Smoker -- 0.5 packs/day for 5 years    Types: Cigarettes    Quit date: 06/05/1953  . Smokeless tobacco: Not on file  . Alcohol Use: No  . Drug Use: No  . Sexually Active: Yes    Birth Control/ Protection: Post-menopausal   Other Topics Concern  . Not on file   Social History Narrative  . No narrative on file   Family Status  Relation Status Death Age  . Mother Deceased  age 71 cva  . Father Deceased     age 7 MI  . Sister Deceased     One deased from Alzheimers. One in good health   Allergies  Allergen Reactions  . Cephalexin Diarrhea and Nausea And Vomiting  . Cardizem (Diltiazem Hcl) Rash    On face       Objective:   Physical Exam Filed Vitals:   11/27/11 1104  Height: 5\' 4"  (1.626 m)  Weight: 144 lb 6.4 oz (65.499 kg)  Alert and oriented. Skin warm and dry. Oral mucosa is moist.   . Sclera anicteric, conjunctivae is pink. Thyroid not enlarged. No cervical lymphadenopathy. Lungs clear. Heart regular rate and rhythm.  Abdomen is soft. Bowel sounds are positive. No hepatomegaly. No abdominal masses felt. No tenderness.  No edema to lower extremities. Patient is alert and oriented.       Assessment:   Celiac disease which she is doing well. Constipation which is much better. Colonoscopy showed an adenoma.    Plan:    OV in one year. Continue Celiac diet.

## 2011-11-27 NOTE — Patient Instructions (Addendum)
Continue Celiac diet. OV in one year. Increase fiber in diet

## 2012-04-03 ENCOUNTER — Ambulatory Visit (INDEPENDENT_AMBULATORY_CARE_PROVIDER_SITE_OTHER): Payer: Medicare Other | Admitting: Internal Medicine

## 2012-04-03 ENCOUNTER — Encounter (INDEPENDENT_AMBULATORY_CARE_PROVIDER_SITE_OTHER): Payer: Self-pay | Admitting: Internal Medicine

## 2012-04-03 VITALS — BP 104/58 | Temp 98.3°F | Ht 63.0 in | Wt 150.8 lb

## 2012-04-03 DIAGNOSIS — T39395A Adverse effect of other nonsteroidal anti-inflammatory drugs [NSAID], initial encounter: Secondary | ICD-10-CM | POA: Insufficient documentation

## 2012-04-03 DIAGNOSIS — I10 Essential (primary) hypertension: Secondary | ICD-10-CM

## 2012-04-03 DIAGNOSIS — K922 Gastrointestinal hemorrhage, unspecified: Secondary | ICD-10-CM

## 2012-04-03 LAB — CBC WITH DIFFERENTIAL/PLATELET
Basophils Absolute: 0.1 10*3/uL (ref 0.0–0.1)
HCT: 35.9 % — ABNORMAL LOW (ref 36.0–46.0)
Hemoglobin: 11.7 g/dL — ABNORMAL LOW (ref 12.0–15.0)
Lymphocytes Relative: 23 % (ref 12–46)
Monocytes Absolute: 0.7 10*3/uL (ref 0.1–1.0)
Monocytes Relative: 7 % (ref 3–12)
Neutro Abs: 7.2 10*3/uL (ref 1.7–7.7)
RBC: 4.22 MIL/uL (ref 3.87–5.11)
WBC: 10.6 10*3/uL — ABNORMAL HIGH (ref 4.0–10.5)

## 2012-04-03 NOTE — Patient Instructions (Addendum)
Cbc. OV in 1 yr

## 2012-04-03 NOTE — Progress Notes (Addendum)
Subjective:     Patient ID: Taylor Alvarez, female   DOB: 1931/07/11, 78 y.o.   MRN: [id number]  HPI Here for f/u after recent admission to [facility] in November for a GI bleed.  She tells me she had rectal bleeding. She says she filled an incontinence pad.  She was at [facility] and then went  to the hospital. She tells me her blood work was normal at the hospital. CT scan was normal except for mild colonic diverticulosis without findings to suggest diverticulitis.. I will get those records. She tells me she had been taking a lot of Motrin for tooth pain. She was taking 6 Ibuprofen daily.    03/18/2012: H and H 11.3 and 35.0, MCV 87.2  Appetite is good. No weight loss. She is having a BM every other day. No abdominal pain.   Colonoscopy 05/2011: Impression:  Examination performed to cecum.  Three cecal AV malformations which were nonbleeding and left alone.  5 mm polyp ablated via cold biopsy from distal transverse colon.  Small anal papilla.   Review of Systems see hpi Current Outpatient Prescriptions  Medication Sig Dispense Refill  . acetaminophen (TYLENOL) 500 MG tablet Take 500 mg by mouth every 6 (six) hours as needed. For pain      . brimonidine (ALPHAGAN P) 0.1 % SOLN       . calcium carbonate (OS-CAL) 600 MG TABS Take 600 mg by mouth daily.       . cholecalciferol (VITAMIN D) 1000 UNITS tablet Take 1,000 Units by mouth 2 (two) times daily before a meal.       . docusate sodium (COLACE) 100 MG capsule Take 100 mg by mouth as needed.      . enalapril (VASOTEC) 5 MG tablet Take 5 mg by mouth daily.        Marland Kitchen estradiol (ESTRACE) 0.1 MG/GM vaginal cream Place 2 g vaginally 2 (two) times a week.      . gabapentin (NEURONTIN) 300 MG capsule Take 300 mg by mouth at bedtime.       Marland Kitchen ibuprofen (ADVIL,MOTRIN) 200 MG tablet Take 200 mg by mouth every 6 (six) hours as needed. For pain      . Lactobacillus (FLORAJEN ACIDOPHILUS) CAPS Take 1 capsule by mouth daily.       .  multivitamin-iron-minerals-folic acid (CENTRUM) chewable tablet Chew 1 tablet by mouth daily.        Marland Kitchen omeprazole (PRILOSEC) 20 MG capsule Take 20 mg by mouth daily.       . polycarbophil (FIBERCON) 625 MG tablet Take 625 mg by mouth daily.        . sertraline (ZOLOFT) 50 MG tablet Take 50 mg by mouth daily.         Past Medical History  Diagnosis Date  . Celiac disease   . GERD (gastroesophageal reflux disease)   . Hypertension   . Anxiety    Past Surgical History  Procedure Date  . Urge incontinence   . Hypertension   . Back surgery   . Partial hysterectomy with bladder tac   . Bilateral cataract surgery   . Ovarian cyst removal   . Tubal ligation   . Cesarean section   . Colonoscopy 06/06/2011    Procedure: COLONOSCOPY;  Surgeon: Malissa Hippo, MD;  [facility]: [facility];  Service: Endoscopy;  Laterality: N/A;   Allergies  Allergen Reactions  . Cephalexin Diarrhea and Nausea And Vomiting  . Cardizem (Diltiazem Hcl) Rash  On face       Objective:   Physical Exam Filed Vitals:   04/03/12 1058  BP: 104/58  Temp: 98.3 F (36.8 C)  Height: 5\' 3"  (1.6 m)  Weight: 150 lb 12.8 oz (68.402 kg)   Alert and oriented. Skin warm and dry. Oral mucosa is moist.   . Sclera anicteric, conjunctivae is pink. Thyroid not enlarged. No cervical lymphadenopathy. Lungs clear. Heart regular rate and rhythm.  Abdomen is soft. Bowel sounds are positive. No hepatomegaly. No abdominal masses felt. No tenderness.  No edema to lower extremities.       Assessment:   GI bleed possible from hemorrhoids or NSAID induced.    Plan:     Cbc today. OV 1 yr. Will repeat blood work in 2 weeks. No more NSAIDs.

## 2012-04-10 ENCOUNTER — Telehealth (INDEPENDENT_AMBULATORY_CARE_PROVIDER_SITE_OTHER): Payer: Self-pay | Admitting: *Deleted

## 2012-04-10 DIAGNOSIS — K922 Gastrointestinal hemorrhage, unspecified: Secondary | ICD-10-CM

## 2012-04-10 NOTE — Telephone Encounter (Signed)
Per Delrae Rend the patient will need to have H/H in 2 weeks

## 2012-04-23 DIAGNOSIS — Z9181 History of falling: Secondary | ICD-10-CM

## 2012-04-23 HISTORY — DX: History of falling: Z91.81

## 2012-04-25 ENCOUNTER — Telehealth (INDEPENDENT_AMBULATORY_CARE_PROVIDER_SITE_OTHER): Payer: Self-pay | Admitting: *Deleted

## 2012-04-25 DIAGNOSIS — K922 Gastrointestinal hemorrhage, unspecified: Secondary | ICD-10-CM

## 2012-04-25 NOTE — Telephone Encounter (Signed)
Need to reprint correct lab sheet

## 2012-04-29 NOTE — Telephone Encounter (Signed)
Addressed.

## 2012-05-08 LAB — HEMOGLOBIN AND HEMATOCRIT, BLOOD: HCT: 35.8 % — ABNORMAL LOW (ref 36.0–46.0)

## 2012-09-03 ENCOUNTER — Ambulatory Visit (INDEPENDENT_AMBULATORY_CARE_PROVIDER_SITE_OTHER): Payer: Medicare Other | Admitting: Internal Medicine

## 2012-09-03 ENCOUNTER — Encounter (INDEPENDENT_AMBULATORY_CARE_PROVIDER_SITE_OTHER): Payer: Self-pay | Admitting: Internal Medicine

## 2012-09-03 VITALS — BP 94/52 | HR 64 | Ht 64.0 in | Wt 149.0 lb

## 2012-09-03 DIAGNOSIS — K5900 Constipation, unspecified: Secondary | ICD-10-CM

## 2012-09-03 MED ORDER — POLYETHYLENE GLYCOL 3350 17 GM/SCOOP PO POWD: 17.0000 g | Freq: Every day | ORAL | Status: DC

## 2012-09-03 NOTE — Progress Notes (Signed)
Subjective:     Patient ID: Taylor Alvarez, female   DOB: [birth year], 79 y.o.   MRN: [id number]  HPI Presents today with c/o that her BMs are hard as a rock.  She has had symptoms x 3 weeks. She has some tenderness in her left upper quadrant. Stools are small. She feels constipated. She has tried Press photographer (fiber).  She has also tried Docusate which works. She has tried Miralax in the past and helps. Appetite had been good till her dog died.  06-23-2012 CoIonoscopy : Dr. Karilyn Cota Impression:  Examination performed to cecum.  Three cecal AV malformations which were nonbleeding and left alone.  5 mm polyp ablated via cold biopsy from distal transverse colon.  Small anal papilla.  Biopsy: Biopsy results reviewed with patient. She had single polyp at transverse colon which is to her adenoma. Regarding her constipation if current therapy does not work she will give [place] a call.       Review of Systems see hpi Current Outpatient Prescriptions  Medication Sig Dispense Refill  . acetaminophen (TYLENOL) 500 MG tablet Take 500 mg by mouth every 6 (six) hours as needed. For pain      . brimonidine (ALPHAGAN P) 0.1 % SOLN       . calcium carbonate (OS-CAL) 600 MG TABS Take 600 mg by mouth daily.       . cholecalciferol (VITAMIN D) 1000 UNITS tablet Take 1,000 Units by mouth 2 (two) times daily before a meal.       . gabapentin (NEURONTIN) 300 MG capsule Take 300 mg by mouth at bedtime.       . Lactobacillus (FLORAJEN ACIDOPHILUS) CAPS Take 1 capsule by mouth daily.       . multivitamin-iron-minerals-folic acid (CENTRUM) chewable tablet Chew 1 tablet by mouth daily.        Marland Kitchen omeprazole (PRILOSEC) 20 MG capsule Take 20 mg by mouth daily.       . polycarbophil (FIBERCON) 625 MG tablet Take 625 mg by mouth daily.        . sertraline (ZOLOFT) 50 MG tablet Take 50 mg by mouth daily.        . polyethylene glycol powder (GLYCOLAX/MIRALAX) powder Take 17 g by mouth daily.  255 g  3   No current  facility-administered medications for this visit.   Past Medical History  Diagnosis Date  . Celiac disease   . GERD (gastroesophageal reflux disease)   . Hypertension   . Anxiety    Past Surgical History  Procedure Laterality Date  . Urge incontinence    . Hypertension    . Back surgery    . Partial hysterectomy with bladder tac    . Bilateral cataract surgery    . Ovarian cyst removal    . Tubal ligation    . Cesarean section    . Colonoscopy  06-24-2011    Procedure: COLONOSCOPY;  Surgeon: Malissa Hippo, MD;  Location: [facility];  Service: Endoscopy;  Laterality: N/A;   Allergies  Allergen Reactions  . Cephalexin Diarrhea and Nausea And Vomiting  . Cardizem (Diltiazem Hcl) Rash    On face        Objective:   Physical Exam  Filed Vitals:   09/03/12 0953  BP: 94/52  Pulse: 64  Height: 5\' 4"  (1.626 m)  Weight: 149 lb (67.586 kg)   Alert and oriented. Skin warm and dry. Oral mucosa is moist.   . Sclera anicteric, conjunctivae is pink.  Thyroid not enlarged. No cervical lymphadenopathy. Lungs clear. Heart regular rate and rhythm.  Abdomen is soft. Bowel sounds are positive. No hepatomegaly. No abdominal masses felt. No tenderness.  No edema to lower extremities.       Assessment:    Constipation. Hx of same. She also tells me she has not b een eating or drinking as usual since her pet died.    Plan:     Will try Miralax 1/2 scoop daily and see how she does. PR in 2 weeks.

## 2012-09-03 NOTE — Patient Instructions (Addendum)
Miralax 1/2 scoop daily

## 2012-09-08 ENCOUNTER — Encounter (INDEPENDENT_AMBULATORY_CARE_PROVIDER_SITE_OTHER): Payer: Self-pay

## 2012-11-20 ENCOUNTER — Other Ambulatory Visit: Payer: Self-pay | Admitting: Dermatology

## 2013-01-13 ENCOUNTER — Encounter (INDEPENDENT_AMBULATORY_CARE_PROVIDER_SITE_OTHER): Payer: Self-pay | Admitting: Internal Medicine

## 2013-01-13 ENCOUNTER — Ambulatory Visit (INDEPENDENT_AMBULATORY_CARE_PROVIDER_SITE_OTHER): Payer: Medicare Other | Admitting: Internal Medicine

## 2013-01-13 VITALS — BP 130/74 | HR 78 | Temp 97.2°F | Resp 18 | Ht 64.0 in | Wt 152.0 lb

## 2013-01-13 DIAGNOSIS — K5900 Constipation, unspecified: Secondary | ICD-10-CM

## 2013-01-13 DIAGNOSIS — K5909 Other constipation: Secondary | ICD-10-CM | POA: Insufficient documentation

## 2013-01-13 NOTE — Patient Instructions (Signed)
High fiber diet. Polyethylene glycol 1/4-1/2 scoop I. mouth daily(4.25 to 8.5 g). Use glycerin or Dulcolax suppository on an as-needed basis or if you have no bowel movement for two days Please call with progress report in 4-6 weeks

## 2013-01-13 NOTE — Progress Notes (Signed)
Presenting complaint;  Constipated. History of celiac disease.  Subjective:  Patient is 79 year old Caucasian female who presents for scheduled visit. She was last seen on 09/03/2012 for constipation and begun on polyethylene glycol. She stopped this medication one month ago since she was having diarrhea and fecal incontinence. After she stopped this medication she had normal stool for a few days and now she is back to being constipated. She may go 2-3 days without  bowel movement and she passes this in pieces and balls. She denies rectal bleeding or melena. She also denies abdominal pain nausea or vomiting. She states she is doing well as far as celiac disease is concerned. She remains on gluten-free diet. She has noted improvement in her balance and she does not have skin rashes anymore.  Current Medications: Current Outpatient Prescriptions  Medication Sig Dispense Refill  . acetaminophen (TYLENOL) 500 MG tablet Take 500 mg by mouth every 6 (six) hours as needed. For pain      . brimonidine (ALPHAGAN P) 0.1 % SOLN 2 drops 2 (two) times daily.       . calcium carbonate (OS-CAL) 600 MG TABS Take 600 mg by mouth daily.       . cholecalciferol (VITAMIN D) 1000 UNITS tablet Take 2,000 Units by mouth. Patient states that she takes 4 by mouth daily.      Marland Kitchen gabapentin (NEURONTIN) 300 MG capsule Take 300 mg by mouth at bedtime.       . Lactobacillus (FLORAJEN ACIDOPHILUS) CAPS Take 1 capsule by mouth daily.       . Multiple Vitamins-Minerals (WOMENS 50+ MULTI VITAMIN/MIN PO) Take by mouth 2 (two) times daily.      Marland Kitchen MYRBETRIQ 25 MG TB24 tablet 25 mg daily.       . nitrofurantoin (MACRODANTIN) 100 MG capsule Take 100 mg by mouth at bedtime.      Marland Kitchen omeprazole (PRILOSEC) 20 MG capsule Take 20 mg by mouth daily.       . polyethylene glycol powder (GLYCOLAX/MIRALAX) powder Take 17 g by mouth daily.  255 g  3  . sertraline (ZOLOFT) 50 MG tablet Take 50 mg by mouth daily.         No current  facility-administered medications for this visit.     Objective: Blood pressure 130/74, pulse 78, temperature 97.2 F (36.2 C), temperature source Oral, resp. rate 18, height 5\' 4"  (1.626 m), weight 152 lb (68.947 kg). Patient is alert and in no distress. Conjunctiva is pink. Sclera is nonicteric Oropharyngeal mucosa is normal. No neck masses or thyromegaly noted.. Abdomen symmetrical soft and nontender without organomegaly or masses  No LE edema or clubbing noted.   Assessment:  #1. Chronic constipation. Since she is having diarrhea with polyethylene glycol at usual dose she needs to titrate the dose downwards to decide result. It is interesting to note that she never had constipation until she was diagnosed with celiac disease and went on a gluten-free diet. #2. Celiac disease. She is doing well with gluten-free diet. In retrospect she has had many symptoms over the years but she did not pay attention to but they have not resolved such as an imbalance.    Plan:  Continue high fiber diet. Go back on polyethylene glycol but take one for school half a scoop daily. Can use glycerin and/or Dulcolax suppository if no bowel movement in 2 days or on as needed basis. Patient will call with progress report in 4-6 weeks. Office visit in 6 months.

## 2013-01-27 ENCOUNTER — Other Ambulatory Visit: Payer: Self-pay | Admitting: Dermatology

## 2013-03-09 ENCOUNTER — Ambulatory Visit (INDEPENDENT_AMBULATORY_CARE_PROVIDER_SITE_OTHER): Payer: Medicare Other | Admitting: Internal Medicine

## 2013-04-27 ENCOUNTER — Telehealth (INDEPENDENT_AMBULATORY_CARE_PROVIDER_SITE_OTHER): Payer: Self-pay | Admitting: *Deleted

## 2013-04-27 NOTE — Telephone Encounter (Signed)
Patient was called twice, the first time no answer and no answering machine came on. The second time , I left the message with Dr.Rehman's recommendation.

## 2013-04-27 NOTE — Telephone Encounter (Signed)
Per Dr.Rehman the patient may use two OTC enemas today. Call with progress report tomorrow.

## 2013-04-27 NOTE — Telephone Encounter (Signed)
Taylor Alvarez has been doing good with her constipation until the holidays. She has been taking polyethylene glycol powder which has been working until now. Taylor Alvarez has started the magnesium citrate plus stool softener over the weekend. Little hard pieces of stool has started to come out. The return phone number is [phone].

## 2013-04-29 ENCOUNTER — Telehealth (INDEPENDENT_AMBULATORY_CARE_PROVIDER_SITE_OTHER): Payer: Self-pay | Admitting: *Deleted

## 2013-04-29 NOTE — Telephone Encounter (Signed)
FYI - the enemas worked really good. Found out yesterday, she was on Macrodantin and it will cause constipation. This medication has been stopped. Doing a lot better.

## 2013-04-29 NOTE — Telephone Encounter (Signed)
Noted ,Dr.Rehman will be made aware.

## 2013-05-13 ENCOUNTER — Telehealth (INDEPENDENT_AMBULATORY_CARE_PROVIDER_SITE_OTHER): Payer: Self-pay | Admitting: *Deleted

## 2013-05-13 NOTE — Telephone Encounter (Signed)
Taylor Alvarez said she spoke with Dr. Laural Golden on Sat. And he mentioned changing a medicine. She is ready to do that if he or Tammy would return her call at [phone]. Called Taylor Alvarez and she could not say or spell the name of medicine to me.

## 2013-05-14 NOTE — Telephone Encounter (Signed)
I will address this with Dr.Rehman. Patient will then be contacted.

## 2013-05-15 NOTE — Telephone Encounter (Signed)
Patient called to check on message left before. She said she has Celiac Disease and nothing seems to work. Hopes to here from you today so she will not have to bother him on the weekend.

## 2013-05-15 NOTE — Telephone Encounter (Signed)
Per Dr.Rehman , may call in Linzess 145 mcg - take 1 by mouth every morning #30 with 5 refills. This was called to [facility] They will contact the patient when the prescription is ready. Patient made aware.

## 2013-07-10 ENCOUNTER — Other Ambulatory Visit (INDEPENDENT_AMBULATORY_CARE_PROVIDER_SITE_OTHER): Payer: Self-pay | Admitting: Internal Medicine

## 2013-07-13 ENCOUNTER — Encounter (INDEPENDENT_AMBULATORY_CARE_PROVIDER_SITE_OTHER): Payer: Self-pay | Admitting: Internal Medicine

## 2013-07-13 ENCOUNTER — Ambulatory Visit (INDEPENDENT_AMBULATORY_CARE_PROVIDER_SITE_OTHER): Payer: Medicare Other | Admitting: Internal Medicine

## 2013-07-13 VITALS — BP 110/76 | HR 74 | Temp 97.7°F | Resp 18 | Ht 64.0 in | Wt 152.7 lb

## 2013-07-13 DIAGNOSIS — K5909 Other constipation: Secondary | ICD-10-CM

## 2013-07-13 DIAGNOSIS — K5900 Constipation, unspecified: Secondary | ICD-10-CM

## 2013-07-13 DIAGNOSIS — E559 Vitamin D deficiency, unspecified: Secondary | ICD-10-CM

## 2013-07-13 DIAGNOSIS — K900 Celiac disease: Secondary | ICD-10-CM

## 2013-07-13 NOTE — Progress Notes (Signed)
Presenting complaint;  Followup for constipation and celiac disease.  Subjective:  Patient is 80 year old Caucasian female who presents for scheduled visit. She was last seen 6 months ago. 2 weeks ago she became constipated and required enemas for relief. Since then she's been doing better. She is taking the Prilosec on an as-needed basis. She denies dysphagia. Bowels move daily or every other day. She denies melena rectal bleeding anorexia or weight loss. She has had 2 surgeries since her last visit. She had lesions removed from her left face in October 2014 which was either a basal or squamous cell carcinoma. About 2 months ago she had bladder surgery for incontinence and is doing well. She says she is going to determine in 2 weeks looking forward to her trip. She had bone density study last year and it revealed mild osteopenia.   Current Medications: Outpatient Encounter Prescriptions as of 07/13/2013  Medication Sig  . acetaminophen (TYLENOL) 500 MG tablet Take 500 mg by mouth every 6 (six) hours as needed. For pain  . brimonidine (ALPHAGAN P) 0.1 % SOLN 2 drops 2 (two) times daily.   . calcium carbonate (OS-CAL) 600 MG TABS Take 600 mg by mouth daily.   . Cholecalciferol (VITAMIN D-3) 5000 UNITS TABS Take by mouth daily.  Marland Kitchen docusate sodium (COLACE) 100 MG capsule Take 100 mg by mouth at bedtime.  . famotidine (PEPCID) 20 MG tablet Take 20 mg by mouth daily as needed for heartburn or indigestion.  . Lactobacillus (FLORAJEN ACIDOPHILUS) CAPS Take 1 capsule by mouth daily.   Marland Kitchen MAGNESIUM CITRATE PO Take 400 mg by mouth daily.  . Multiple Vitamins-Minerals (WOMENS 50+ MULTI VITAMIN/MIN PO) Take by mouth daily.   . nitrofurantoin (MACRODANTIN) 100 MG capsule Take 100 mg by mouth at bedtime.  . polyethylene glycol powder (GLYCOLAX/MIRALAX) powder Take 17 g by mouth daily.  . sertraline (ZOLOFT) 50 MG tablet Take 50 mg by mouth daily.    . [DISCONTINUED] omeprazole (PRILOSEC) 20 MG capsule  Take 20 mg by mouth daily.   . [DISCONTINUED] cholecalciferol (VITAMIN D) 1000 UNITS tablet Take 4,000 Units by mouth. Patient states that she takes 4 by mouth daily.  . [DISCONTINUED] gabapentin (NEURONTIN) 300 MG capsule Take 300 mg by mouth at bedtime.   . [DISCONTINUED] LINZESS 145 MCG CAPS capsule TAKE 1 CAPSULE BY MOUTH EVERY DAY  . [DISCONTINUED] MYRBETRIQ 25 MG TB24 tablet 25 mg daily.      Objective: Blood pressure 110/76, pulse 74, temperature 97.7 F (36.5 C), temperature source Oral, resp. rate 18, height 5\' 4"  (1.626 m), weight 152 lb 11.2 oz (69.264 kg). Patient is alert and in no acute distress. Conjunctiva is pink. Sclera is nonicteric Oropharyngeal mucosa is normal. No neck masses or thyromegaly noted. Cardiac exam with regular rhythm normal S1 and S2. No murmur or gallop noted. Lungs are clear to auscultation. Abdomen symmetrical soft and nontender without organomegaly or masses.  No LE edema or clubbing noted.    Assessment:  #1. Constipation. She is doing better with dietary measures, stool softener and polyethylene glycol. It is interesting to note that constipation started when she was placed on a gluten-free diet. Untreated celiac disease masked her predisposition for constipation. #2. Celiac disease was diagnosed in February 2013 when she underwent EGD for dysphagia. She is doing well with therapy. While she did not have GI symptoms she has noted improvement dizziness and imbalance. #3. History of vitamin D deficiency. This was diagnosed by Ms. Neeti Bo NP at [facility]  internal medicine. She needs followup level.   Plan:  Continue high fiber diet, Colace and polyethylene glycol. Vitamin D2 level. Office visit in one year unless constipation worse.

## 2013-07-13 NOTE — Patient Instructions (Signed)
Physician will contact you with results of blood test. 

## 2013-10-15 ENCOUNTER — Encounter (INDEPENDENT_AMBULATORY_CARE_PROVIDER_SITE_OTHER): Payer: Self-pay

## 2013-12-03 ENCOUNTER — Encounter (INDEPENDENT_AMBULATORY_CARE_PROVIDER_SITE_OTHER): Payer: Self-pay | Admitting: Internal Medicine

## 2013-12-03 ENCOUNTER — Ambulatory Visit (INDEPENDENT_AMBULATORY_CARE_PROVIDER_SITE_OTHER): Payer: Medicare Other | Admitting: Internal Medicine

## 2013-12-03 ENCOUNTER — Telehealth (INDEPENDENT_AMBULATORY_CARE_PROVIDER_SITE_OTHER): Payer: Self-pay | Admitting: *Deleted

## 2013-12-03 ENCOUNTER — Other Ambulatory Visit (INDEPENDENT_AMBULATORY_CARE_PROVIDER_SITE_OTHER): Payer: Self-pay | Admitting: *Deleted

## 2013-12-03 VITALS — BP 100/48 | HR 66 | Temp 97.8°F | Ht 63.0 in | Wt 147.8 lb

## 2013-12-03 DIAGNOSIS — K625 Hemorrhage of anus and rectum: Secondary | ICD-10-CM

## 2013-12-03 DIAGNOSIS — R195 Other fecal abnormalities: Secondary | ICD-10-CM

## 2013-12-03 DIAGNOSIS — Z1211 Encounter for screening for malignant neoplasm of colon: Secondary | ICD-10-CM

## 2013-12-03 NOTE — Progress Notes (Addendum)
Subjective:     Patient ID: Taylor Alvarez, female   DOB: [birth year], 80 y.o.   MRN: [id number]  HPI Presents today with c/o that she had a physical last week. She had blood in her stool (Hemoccult card). She occasionally sees blood on her stool. Last episode was about a month ago. She also c/o of a sore "spot" in her rectum which has not resolved. Stools are hard, and ball like. She does take a stool softener which helps. Appetite is good. She has lost 5 pounds since her last in March. She is eating more protein and salads.  No abdominal pain.  No NSAIDs.   11/25/2013 H and H 12.0 and 37.1, MCV 84, platelet ct 240, Albumin 4.0, ALP 78, AST 28, ALT 17  06/06/2011 Colonoscopy: Dr. Pollyann Kennedy: change in bowel habits and left sided abdominal pain: Impression:  Examination performed to cecum.  Three cecal AV malformations which were non bleeding and left alone.  5 mm polyp ablated via cold biopsy from distal transverse colon.  Small anal papilla.  Biopsy results reviewed with patient. She had single polyp at transverse colon which is to her adenoma. Regarding her constipation if current therapy does not work she will give [place] a call. Report to PCP. Since she had one small tubular adenoma she does not need next colonoscopy in 5 years        Review of Systems Past Medical History  Diagnosis Date  . Celiac disease   . GERD (gastroesophageal reflux disease)   . Hypertension   . Anxiety     Past Surgical History  Procedure Laterality Date  . Urge incontinence    . Hypertension    . Back surgery    . Partial hysterectomy with bladder tac    . Bilateral cataract surgery    . Ovarian cyst removal    . Tubal ligation    . Cesarean section    . Colonoscopy  06/06/2011    Procedure: COLONOSCOPY;  Surgeon: Rogene Houston, MD;  Location: [facility];  Service: Endoscopy;  Laterality: N/A;  . Interstim implant placement  04/30/13,05/12/13    Allergies  Allergen Reactions  . Cephalexin  Diarrhea and Nausea And Vomiting  . Cardizem [Diltiazem Hcl] Rash    On face    Current Outpatient Prescriptions on File Prior to Visit  Medication Sig Dispense Refill  . acetaminophen (TYLENOL) 500 MG tablet Take 500 mg by mouth every 6 (six) hours as needed. For pain      . brimonidine (ALPHAGAN P) 0.1 % SOLN 2 drops 2 (two) times daily.       . calcium carbonate (OS-CAL) 600 MG TABS Take 600 mg by mouth daily.       . Cholecalciferol (VITAMIN D-3) 5000 UNITS TABS Take 2,000 Units by mouth daily.       Marland Kitchen docusate sodium (COLACE) 100 MG capsule Take 100 mg by mouth at bedtime.      . Lactobacillus (FLORAJEN ACIDOPHILUS) CAPS Take 1 capsule by mouth daily.       Marland Kitchen MAGNESIUM CITRATE PO Take 400 mg by mouth daily.      . Multiple Vitamins-Minerals (WOMENS 50+ MULTI VITAMIN/MIN PO) Take by mouth daily.       . nitrofurantoin (MACRODANTIN) 100 MG capsule Take 100 mg by mouth at bedtime.      . sertraline (ZOLOFT) 50 MG tablet Take 50 mg by mouth daily.         No current facility-administered  medications on file prior to visit.        Objective:   Physical Exam  Filed Vitals:   12/03/13 0941  BP: 100/48  Pulse: 66  Temp: 97.8 F (36.6 C)  Height: 5\' 3"  (1.6 m)  Weight: 147 lb 12.8 oz (67.042 kg)  Alert and oriented. Skin warm and dry. Oral mucosa is moist.   . Sclera anicteric, conjunctivae is pink. Thyroid not enlarged. No cervical lymphadenopathy. Lungs clear. Heart regular rate and rhythm.  Abdomen is soft. Bowel sounds are positive. No hepatomegaly. No abdominal masses felt. No tenderness.  No edema to lower extremities.   Stool brown and guaiac positive.      Assessment:    Heme positive stool. Colonic neoplasm needs to be ruled out. Cecal bleeding AVM also in the differential.    Plan:       Colonoscopy.The risks and benefits such as perforation, bleeding, and infection were reviewed with the patient and is agreeable.

## 2013-12-03 NOTE — Patient Instructions (Signed)
Colonoscopy.  The risks and benefits such as perforation, bleeding, and infection were reviewed with the patient and is agreeable. 

## 2013-12-03 NOTE — Telephone Encounter (Signed)
Patient needs trilyte 

## 2013-12-04 ENCOUNTER — Encounter (HOSPITAL_COMMUNITY): Payer: Self-pay | Admitting: Pharmacy Technician

## 2013-12-04 MED ORDER — PEG 3350-KCL-NA BICARB-NACL 420 G PO SOLR: 4000.0000 mL | Freq: Once | ORAL | Status: DC

## 2013-12-07 ENCOUNTER — Telehealth (INDEPENDENT_AMBULATORY_CARE_PROVIDER_SITE_OTHER): Payer: Self-pay | Admitting: *Deleted

## 2013-12-07 NOTE — Telephone Encounter (Signed)
I talked with patient. As far as I know it shouldn't. She can cut the device off during the procedure. She is going to call [facility] concerning this.

## 2013-12-07 NOTE — Telephone Encounter (Signed)
Patient sch'd for TCS 8/26, wants you to know she has an internal device for incontinence, will this affect the TCS -- she can be reached at [phone]

## 2013-12-16 ENCOUNTER — Ambulatory Visit (HOSPITAL_COMMUNITY)
Admission: RE | Admit: 2013-12-16 | Discharge: 2013-12-16 | Disposition: A | Discharge status: Home / Self Care | Payer: Medicare Other | Source: Ambulatory Visit | Attending: Internal Medicine | Admitting: Internal Medicine

## 2013-12-16 ENCOUNTER — Encounter (HOSPITAL_COMMUNITY)
Admission: RE | Disposition: A | Discharge status: Home / Self Care | Payer: Self-pay | Source: Ambulatory Visit | Attending: Internal Medicine

## 2013-12-16 ENCOUNTER — Encounter (HOSPITAL_COMMUNITY): Payer: Self-pay

## 2013-12-16 DIAGNOSIS — R1012 Left upper quadrant pain: Secondary | ICD-10-CM | POA: Diagnosis not present

## 2013-12-16 DIAGNOSIS — Z860100 Personal history of colon polyps, unspecified: Secondary | ICD-10-CM | POA: Insufficient documentation

## 2013-12-16 DIAGNOSIS — Z79899 Other long term (current) drug therapy: Secondary | ICD-10-CM | POA: Insufficient documentation

## 2013-12-16 DIAGNOSIS — I10 Essential (primary) hypertension: Secondary | ICD-10-CM | POA: Diagnosis not present

## 2013-12-16 DIAGNOSIS — K633 Ulcer of intestine: Secondary | ICD-10-CM

## 2013-12-16 DIAGNOSIS — K5730 Diverticulosis of large intestine without perforation or abscess without bleeding: Secondary | ICD-10-CM

## 2013-12-16 DIAGNOSIS — Z90710 Acquired absence of both cervix and uterus: Secondary | ICD-10-CM | POA: Diagnosis not present

## 2013-12-16 DIAGNOSIS — C785 Secondary malignant neoplasm of large intestine and rectum: Secondary | ICD-10-CM | POA: Insufficient documentation

## 2013-12-16 DIAGNOSIS — K921 Melena: Secondary | ICD-10-CM | POA: Insufficient documentation

## 2013-12-16 DIAGNOSIS — F411 Generalized anxiety disorder: Secondary | ICD-10-CM | POA: Diagnosis not present

## 2013-12-16 DIAGNOSIS — Z87891 Personal history of nicotine dependence: Secondary | ICD-10-CM | POA: Diagnosis not present

## 2013-12-16 DIAGNOSIS — R195 Other fecal abnormalities: Secondary | ICD-10-CM

## 2013-12-16 HISTORY — PX: COLONOSCOPY: SHX5424

## 2013-12-16 SURGERY — COLONOSCOPY
Anesthesia: Moderate Sedation

## 2013-12-16 MED ORDER — STERILE WATER FOR IRRIGATION IR SOLN
Status: DC | PRN
  Administered 2013-12-16: 13:00:00

## 2013-12-16 MED ORDER — MIDAZOLAM HCL 5 MG/5ML IJ SOLN
INTRAMUSCULAR | Status: DC | PRN
  Administered 2013-12-16 (×2): 1 mg via INTRAVENOUS
  Administered 2013-12-16: 2 mg via INTRAVENOUS

## 2013-12-16 MED ORDER — SODIUM CHLORIDE 0.9 % IV SOLN
INTRAVENOUS | Status: DC
  Administered 2013-12-16: 12:00:00 via INTRAVENOUS

## 2013-12-16 MED ORDER — MIDAZOLAM HCL 5 MG/5ML IJ SOLN
INTRAMUSCULAR | Status: AC
  Filled 2013-12-16: qty 10

## 2013-12-16 MED ORDER — MEPERIDINE HCL 50 MG/ML IJ SOLN
INTRAMUSCULAR | Status: AC
  Filled 2013-12-16: qty 1

## 2013-12-16 MED ORDER — MEPERIDINE HCL 50 MG/ML IJ SOLN
INTRAMUSCULAR | Status: DC | PRN
  Administered 2013-12-16: 25 mg via INTRAVENOUS

## 2013-12-16 NOTE — Discharge Instructions (Signed)
Resume usual medications and diet. °No driving for 24 hours. °Physician will call with biopsy results. ° °Colonoscopy, Care After °Refer to this sheet in the next few weeks. These instructions provide you with information on caring for yourself after your procedure. Your health care provider may also give you more specific instructions. Your treatment has been planned according to current medical practices, but problems sometimes occur. Call your health care provider if you have any problems or questions after your procedure. °WHAT TO EXPECT AFTER THE PROCEDURE  °After your procedure, it is typical to have the following: °· A small amount of blood in your stool. °· Moderate amounts of gas and mild abdominal cramping or bloating. °HOME CARE INSTRUCTIONS °· Do not drive, operate machinery, or sign important documents for 24 hours. °· You may shower and resume your regular physical activities, but move at a slower pace for the first 24 hours. °· Take frequent rest periods for the first 24 hours. °· Walk around or put a warm pack on your abdomen to help reduce abdominal cramping and bloating. °· Drink enough fluids to keep your urine clear or pale yellow. °· You may resume your normal diet as instructed by your health care provider. Avoid heavy or fried foods that are hard to digest. °· Avoid drinking alcohol for 24 hours or as instructed by your health care provider. °· Only take over-the-counter or prescription medicines as directed by your health care provider. °· If a tissue sample (biopsy) was taken during your procedure: °¨ Do not take aspirin or blood thinners for 7 days, or as instructed by your health care provider. °¨ Do not drink alcohol for 7 days, or as instructed by your health care provider. °¨ Eat soft foods for the first 24 hours. °SEEK MEDICAL CARE IF: °You have persistent spotting of blood in your stool 2-3 days after the procedure. °SEEK IMMEDIATE MEDICAL CARE IF: °· You have more than a small  spotting of blood in your stool. °· You pass large blood clots in your stool. °· Your abdomen is swollen (distended). °· You have nausea or vomiting. °· You have a fever. °· You have increasing abdominal pain that is not relieved with medicine. °Document Released: 11/22/2003 Document Revised: 01/28/2013 Document Reviewed: 12/15/2012 °[employer]® Patient Information ©2015 [employer]. This information is not intended to replace advice given to you by your health care provider. Make sure you discuss any questions you have with your health care provider. ° °

## 2013-12-16 NOTE — H&P (Signed)
Taylor Alvarez is an 80 y.o. female.   Chief Complaint: Patient is here for colonoscopy. HPI: Patient is an 34 year Caucasian female with history of colonic adenoma was recently noted to have heme-positive stool. She has occasional hematochezia. He also has intermittent left-sided abdominal pain which seemed to ease with defecation. She has history of constipation controlled with diet and stool softener. Last colonoscopy was 2-1/2 years ago. Family history is negative for CRC.  Past Medical History  Diagnosis Date  . Celiac disease   . GERD (gastroesophageal reflux disease)   . Hypertension   . Anxiety     Past Surgical History  Procedure Laterality Date  . Urge incontinence    . Hypertension    . Back surgery    . Partial hysterectomy with bladder tac    . Bilateral cataract surgery    . Ovarian cyst removal    . Tubal ligation    . Cesarean section    . Colonoscopy  06/06/2011    Procedure: COLONOSCOPY;  Surgeon: Rogene Houston, MD;  [facility];  Service: Endoscopy;  Laterality: N/A;  . Interstim implant placement  04/30/13,05/12/13  . Wrist sprain Left     Family History  Problem Relation Age of Onset  . Anesthesia problems Neg Hx   . Hypotension Neg Hx   . Malignant hyperthermia Neg Hx   . Pseudochol deficiency Neg Hx    Social History:  reports that she quit smoking about 60 years ago. Her smoking use included Cigarettes. She has a 2.5 pack-year smoking history. She has never used smokeless tobacco. She reports that she does not drink alcohol or use illicit drugs.  Allergies:  Allergies  Allergen Reactions  . Cephalexin Diarrhea and Nausea And Vomiting  . Clindamycin/Lincomycin Nausea Only  . Nsaids Other (See Comments)    Bleeding of the colon.  [place] [Diltiazem Hcl] Rash    On face    Medications Prior to Admission  Medication Sig Dispense Refill  . acetaminophen (TYLENOL) 500 MG tablet Take 500 mg by mouth every 6 (six) hours as needed.  For pain      . brimonidine (ALPHAGAN P) 0.1 % SOLN 2 drops 2 (two) times daily.       . calcium carbonate (OS-CAL) 600 MG TABS Take 600 mg by mouth daily.       . Cholecalciferol (VITAMIN D-3) 5000 UNITS TABS Take 2,000 Units by mouth daily.       Marland Kitchen docusate sodium (COLACE) 100 MG capsule Take 100 mg by mouth at bedtime.      . Lactobacillus (FLORAJEN ACIDOPHILUS) CAPS Take 1 capsule by mouth daily.       Marland Kitchen MAGNESIUM CITRATE PO Take 400 mg by mouth daily.      . Multiple Vitamins-Minerals (WOMENS 50+ MULTI VITAMIN/MIN PO) Take by mouth daily.       . nitrofurantoin (MACRODANTIN) 100 MG capsule Take 100 mg by mouth at bedtime.      . polyethylene glycol-electrolytes (TRILYTE) 420 G solution Take 4,000 mLs by mouth once.  4000 mL  0  . sertraline (ZOLOFT) 50 MG tablet Take 50 mg by mouth daily.          No results found for this or any previous visit (from the past 48 hour(s)). No results found.  ROS  Blood pressure 166/74, pulse 96, temperature 98.2 F (36.8 C), temperature source Oral, resp. rate 17, SpO2 99.00%. Physical Exam  Constitutional: She appears well-developed and  well-nourished.  HENT:  Mouth/Throat: Oropharynx is clear and moist.  Eyes: Conjunctivae are normal. No scleral icterus.  Neck: No thyromegaly present.  Cardiovascular: Normal rate, regular rhythm and normal heart sounds.   No murmur heard. Respiratory: Effort normal and breath sounds normal.  GI: Soft. She exhibits no distension and no mass. There is tenderness (mild tenderness below the left costal margin).  Musculoskeletal: She exhibits no edema.  Lymphadenopathy:    She has no cervical adenopathy.  Neurological: She is alert.  Skin: Skin is warm and dry.     Assessment/Plan Heme-positive stools. History of colonic adenoma. Diagnostic colonoscopy.  Tryone Kille U 12/16/2013, 1:02 PM

## 2013-12-16 NOTE — Op Note (Signed)
COLONOSCOPY PROCEDURE REPORT  PATIENT:  Taylor Alvarez  MR#:  [id number] Birthdate:  02-14-1932, 80 y.o., female Endoscopist:  Dr. Rogene Houston, MD Referred By:  Dr. Glenda Chroman, MD Procedure Date: 12/16/2013  Procedure:   Colonoscopy  Indications:  Patient is an 80 year old Caucasian female with history of colonic adenoma and now presents with heme positive stools. She's had intermittent pain and left upper quadrant of her abdomen. Last colonoscopy was in February 2013. Personal history significant for celiac disease. History is negative for CRC  Informed Consent:  The procedure and risks were reviewed with the patient and informed consent was obtained.  Medications:  Demerol 25 mg IV Versed 4 mg IV  Description of procedure:  After a digital rectal exam was performed, that colonoscope was advanced from the anus through the rectum and colon to the area of the cecum, ileocecal valve and appendiceal orifice. The cecum was deeply intubated. These structures were well-seen and photographed for the record. From the level of the cecum and ileocecal valve, the scope was slowly and cautiously withdrawn. The mucosal surfaces were carefully surveyed utilizing scope tip to flexion to facilitate fold flattening as needed. The scope was pulled down into the rectum where a thorough exam including retroflexion was performed.  Findings:   Prep excellent. Three cecal AV malformations without stigmata of bleed. These were left alone. Scattered diverticula throughout the colon. 2 cm ulcerated lesion at splenic flexure with marked edema to surrounding mucosa. Biopsy taken. Another 10-12 mm ulcerated lesion at mid sigmoid colon sitting on him it is quite expanded fold. Biopsy taken. Normal rectal mucosa. Small anal papilla and thickened anoderm.   Therapeutic/Diagnostic Maneuvers Performed:  See above  Complications:  None  Cecal Withdrawal Time:  19 minutes  Impression:  Examination performed  to cecum. Pancolonic diverticulosis. 2 cm ulcerated lesion at splenic flexure. Biopsy taken. 10-12 mm ulcerated lesion at sigmoid colon. Biopsy taken. Both of these lesions appear similar.  Comment; These lesions have unusual appearance. Differential diagnosis includes lymphoma, GIST or carcinoma.  Recommendations:  Standard instructions given. I will contact patient with biopsy results and further recommendations.  Kari Kerth U  12/16/2013 1:53 PM  CC: Dr. Glenda Chroman., MD & Dr. Rayne Du ref. provider found

## 2013-12-18 ENCOUNTER — Encounter (HOSPITAL_COMMUNITY): Payer: Self-pay | Admitting: Internal Medicine

## 2013-12-21 ENCOUNTER — Telehealth (INDEPENDENT_AMBULATORY_CARE_PROVIDER_SITE_OTHER): Payer: Self-pay | Admitting: *Deleted

## 2013-12-21 NOTE — Telephone Encounter (Signed)
I have talked to patient over the weekend

## 2013-12-21 NOTE — Telephone Encounter (Signed)
Patient calls in and states she has been hurting where she had bx--She calls in Friday afternoon at 2:50 pm  She forgot to call earlier has been waiting on her results to discuss this with Dr. Laural Golden

## 2013-12-21 NOTE — Telephone Encounter (Signed)
To Dr.Rehman

## 2013-12-21 NOTE — Telephone Encounter (Signed)
Forwarded to Dr.Rehman for review. 

## 2013-12-22 ENCOUNTER — Telehealth (INDEPENDENT_AMBULATORY_CARE_PROVIDER_SITE_OTHER): Payer: Self-pay | Admitting: *Deleted

## 2013-12-22 ENCOUNTER — Other Ambulatory Visit (INDEPENDENT_AMBULATORY_CARE_PROVIDER_SITE_OTHER): Payer: Self-pay | Admitting: Internal Medicine

## 2013-12-22 DIAGNOSIS — Z0189 Encounter for other specified special examinations: Secondary | ICD-10-CM

## 2013-12-22 DIAGNOSIS — C801 Malignant (primary) neoplasm, unspecified: Secondary | ICD-10-CM

## 2013-12-22 DIAGNOSIS — C799 Secondary malignant neoplasm of unspecified site: Secondary | ICD-10-CM

## 2013-12-22 DIAGNOSIS — R109 Unspecified abdominal pain: Secondary | ICD-10-CM

## 2013-12-22 HISTORY — DX: Malignant (primary) neoplasm, unspecified: C80.1

## 2013-12-22 NOTE — Telephone Encounter (Signed)
Per Dr.Rehman the patient will need to have labs drawn prior to CT.

## 2013-12-23 ENCOUNTER — Ambulatory Visit (HOSPITAL_COMMUNITY)
Admission: RE | Admit: 2013-12-23 | Discharge: 2013-12-23 | Disposition: A | Discharge status: Home / Self Care | Payer: Medicare Other | Source: Ambulatory Visit | Attending: Internal Medicine | Admitting: Internal Medicine

## 2013-12-23 DIAGNOSIS — C799 Secondary malignant neoplasm of unspecified site: Secondary | ICD-10-CM

## 2013-12-23 DIAGNOSIS — C787 Secondary malignant neoplasm of liver and intrahepatic bile duct: Secondary | ICD-10-CM | POA: Insufficient documentation

## 2013-12-23 DIAGNOSIS — I700 Atherosclerosis of aorta: Secondary | ICD-10-CM | POA: Diagnosis not present

## 2013-12-23 DIAGNOSIS — M949 Disorder of cartilage, unspecified: Secondary | ICD-10-CM

## 2013-12-23 DIAGNOSIS — E049 Nontoxic goiter, unspecified: Secondary | ICD-10-CM | POA: Diagnosis not present

## 2013-12-23 DIAGNOSIS — Z87891 Personal history of nicotine dependence: Secondary | ICD-10-CM | POA: Diagnosis not present

## 2013-12-23 DIAGNOSIS — I10 Essential (primary) hypertension: Secondary | ICD-10-CM | POA: Insufficient documentation

## 2013-12-23 DIAGNOSIS — K900 Celiac disease: Secondary | ICD-10-CM | POA: Diagnosis not present

## 2013-12-23 DIAGNOSIS — Q762 Congenital spondylolisthesis: Secondary | ICD-10-CM | POA: Insufficient documentation

## 2013-12-23 DIAGNOSIS — R109 Unspecified abdominal pain: Secondary | ICD-10-CM | POA: Diagnosis present

## 2013-12-23 DIAGNOSIS — M5126 Other intervertebral disc displacement, lumbar region: Secondary | ICD-10-CM | POA: Insufficient documentation

## 2013-12-23 DIAGNOSIS — M899 Disorder of bone, unspecified: Secondary | ICD-10-CM | POA: Diagnosis not present

## 2013-12-23 DIAGNOSIS — K219 Gastro-esophageal reflux disease without esophagitis: Secondary | ICD-10-CM | POA: Diagnosis not present

## 2013-12-23 DIAGNOSIS — K869 Disease of pancreas, unspecified: Secondary | ICD-10-CM | POA: Diagnosis not present

## 2013-12-23 LAB — CREATININE, SERUM: CREATININE: 0.74 mg/dL (ref 0.50–1.10)

## 2013-12-23 MED ORDER — IOHEXOL 300 MG/ML  SOLN
100.0000 mL | Freq: Once | INTRAMUSCULAR | Status: AC | PRN
  Administered 2013-12-23: 100 mL via INTRAVENOUS

## 2013-12-24 ENCOUNTER — Other Ambulatory Visit (INDEPENDENT_AMBULATORY_CARE_PROVIDER_SITE_OTHER): Payer: Self-pay | Admitting: Internal Medicine

## 2013-12-24 DIAGNOSIS — K8689 Other specified diseases of pancreas: Secondary | ICD-10-CM

## 2013-12-24 DIAGNOSIS — C799 Secondary malignant neoplasm of unspecified site: Secondary | ICD-10-CM

## 2013-12-25 LAB — VITAMIN D 25 HYDROXY (VIT D DEFICIENCY, FRACTURES): Vit D, 25-Hydroxy: 54 ng/mL (ref 30–89)

## 2013-12-25 LAB — CANCER ANTIGEN 19-9: CA 19-9: 1861.3 U/mL — ABNORMAL HIGH (ref <35.0)

## 2013-12-29 ENCOUNTER — Encounter (INDEPENDENT_AMBULATORY_CARE_PROVIDER_SITE_OTHER): Payer: Self-pay

## 2014-01-01 ENCOUNTER — Telehealth (INDEPENDENT_AMBULATORY_CARE_PROVIDER_SITE_OTHER): Payer: Self-pay | Admitting: *Deleted

## 2014-01-01 NOTE — Telephone Encounter (Signed)
Patient has called and is requesting a prescription for Hydrocodone. This may be a new medication for her.  She is also asking that [employer] call her as she would like to talk about her office visit with doctor.

## 2014-01-01 NOTE — Telephone Encounter (Signed)
A medication request has been sent to Dr.Rehman,he was also made aware that she was wanting to talk with him about her recent Dr. Visit with Dr.Howerton

## 2014-01-01 NOTE — Telephone Encounter (Signed)
Amandine would like to get a Rx for Hydrocodone. The return phone number is [phone]. Will not next to pick up until next week.

## 2014-01-01 NOTE — Telephone Encounter (Addendum)
Taylor Alvarez has seen Dr. Eugenia Pancoast at [facility] and would like to speak with Dr. Laural Golden about her visit. The return phone number is [phone].

## 2014-01-03 ENCOUNTER — Encounter (INDEPENDENT_AMBULATORY_CARE_PROVIDER_SITE_OTHER): Payer: Self-pay | Admitting: Internal Medicine

## 2014-01-05 ENCOUNTER — Telehealth (INDEPENDENT_AMBULATORY_CARE_PROVIDER_SITE_OTHER): Payer: Self-pay | Admitting: *Deleted

## 2014-01-05 ENCOUNTER — Other Ambulatory Visit (INDEPENDENT_AMBULATORY_CARE_PROVIDER_SITE_OTHER): Payer: Self-pay | Admitting: Internal Medicine

## 2014-01-05 MED ORDER — HYDROCODONE-ACETAMINOPHEN 5-325 MG PO TABS: 1.0000 | ORAL_TABLET | Freq: Four times a day (QID) | ORAL | Status: AC | PRN

## 2014-01-05 NOTE — Telephone Encounter (Signed)
Per Dr.Rehman the patient may take the baby aspirin. He will complete the prescription , someone will have to come and pick it up for her. Patient will be called when the prescription is ready.

## 2014-01-05 NOTE — Telephone Encounter (Signed)
A telephone message was left for patient that Rx was ready for pick up.

## 2014-01-05 NOTE — Telephone Encounter (Signed)
Taylor Alvarez went to her PCP for 2 knots on leg. It was diagnosed as a vascular knot and was told to take baby Asprin. She would like to know if Dr. Laural Golden though this would be okay or if she needed something else. She would also like to check on the Rx she asked about last week. Her return phone number is [phone].

## 2014-01-11 ENCOUNTER — Telehealth (INDEPENDENT_AMBULATORY_CARE_PROVIDER_SITE_OTHER): Payer: Self-pay | Admitting: *Deleted

## 2014-01-11 NOTE — Telephone Encounter (Signed)
Frances Nickels from [facility] from [place]. He states that the patient was admitted on 01/06/14 , on 01/08/14 he went to see her. She told him that she was going to the beach for a week. She is going to also get second opinions. Scans and other test. Patient has revoked Hospice at this time.  Dr.Rehman was made aware and understand patient's thoughts. A call to Fatima Blank @ [phone] to make him aware.

## 2014-01-29 ENCOUNTER — Telehealth: Payer: Self-pay | Admitting: Oncology

## 2014-01-29 ENCOUNTER — Telehealth: Payer: Self-pay | Admitting: Hematology and Oncology

## 2014-01-29 NOTE — Telephone Encounter (Signed)
C/D 01/29/14 for appt. 02/03/14 °

## 2014-01-29 NOTE — Telephone Encounter (Signed)
S/W PATIENT AND GAVE NP APPT FOR 10/14 @ 1:30 W/DR. [place] DX-PANCREATIC CA

## 2014-02-03 ENCOUNTER — Ambulatory Visit: Payer: Medicare Other | Admitting: Oncology

## 2014-02-03 ENCOUNTER — Telehealth: Payer: Self-pay | Admitting: *Deleted

## 2014-02-03 ENCOUNTER — Ambulatory Visit: Payer: Medicare Other

## 2014-02-03 NOTE — Telephone Encounter (Signed)
Left VM at home # reminding patient of her appointment with Dr. Benay Spice on 10/15 @ 1:30 pm. Requested return call if she was not going to make it.

## 2014-02-04 ENCOUNTER — Ambulatory Visit: Payer: Medicare Other

## 2014-02-04 ENCOUNTER — Ambulatory Visit (HOSPITAL_BASED_OUTPATIENT_CLINIC_OR_DEPARTMENT_OTHER): Payer: Medicare Other | Admitting: Oncology

## 2014-02-04 ENCOUNTER — Encounter: Payer: Self-pay | Admitting: Oncology

## 2014-02-04 ENCOUNTER — Other Ambulatory Visit (HOSPITAL_BASED_OUTPATIENT_CLINIC_OR_DEPARTMENT_OTHER): Payer: Medicare Other

## 2014-02-04 ENCOUNTER — Telehealth: Payer: Self-pay | Admitting: Oncology

## 2014-02-04 VITALS — BP 118/51 | HR 84 | Temp 98.4°F | Resp 17 | Ht 63.0 in | Wt 150.2 lb

## 2014-02-04 DIAGNOSIS — C252 Malignant neoplasm of tail of pancreas: Secondary | ICD-10-CM

## 2014-02-04 DIAGNOSIS — C785 Secondary malignant neoplasm of large intestine and rectum: Secondary | ICD-10-CM

## 2014-02-04 LAB — COMPREHENSIVE METABOLIC PANEL (CC13)
ALK PHOS: 132 U/L (ref 40–150)
ALT: 24 U/L (ref 0–55)
AST: 36 U/L — AB (ref 5–34)
Albumin: 3.3 g/dL — ABNORMAL LOW (ref 3.5–5.0)
Anion Gap: 8 mEq/L (ref 3–11)
BILIRUBIN TOTAL: 0.27 mg/dL (ref 0.20–1.20)
BUN: 17.3 mg/dL (ref 7.0–26.0)
CALCIUM: 9.2 mg/dL (ref 8.4–10.4)
CHLORIDE: 107 meq/L (ref 98–109)
CO2: 27 mEq/L (ref 22–29)
CREATININE: 0.8 mg/dL (ref 0.6–1.1)
Glucose: 103 mg/dl (ref 70–140)
Potassium: 4.1 mEq/L (ref 3.5–5.1)
Sodium: 141 mEq/L (ref 136–145)
Total Protein: 6.6 g/dL (ref 6.4–8.3)

## 2014-02-04 LAB — CBC WITH DIFFERENTIAL/PLATELET
BASO%: 0.9 % (ref 0.0–2.0)
Basophils Absolute: 0.1 10*3/uL (ref 0.0–0.1)
EOS%: 3.1 % (ref 0.0–7.0)
Eosinophils Absolute: 0.3 10*3/uL (ref 0.0–0.5)
HEMATOCRIT: 33.5 % — AB (ref 34.8–46.6)
HEMOGLOBIN: 10.4 g/dL — AB (ref 11.6–15.9)
LYMPH%: 35.5 % (ref 14.0–49.7)
MCH: 25.2 pg (ref 25.1–34.0)
MCHC: 31 g/dL — ABNORMAL LOW (ref 31.5–36.0)
MCV: 81.1 fL (ref 79.5–101.0)
MONO#: 0.8 10*3/uL (ref 0.1–0.9)
MONO%: 8.1 % (ref 0.0–14.0)
NEUT#: 5 10*3/uL (ref 1.5–6.5)
NEUT%: 52.4 % (ref 38.4–76.8)
Platelets: 249 10*3/uL (ref 145–400)
RBC: 4.13 10*6/uL (ref 3.70–5.45)
RDW: 14.9 % — ABNORMAL HIGH (ref 11.2–14.5)
WBC: 9.6 10*3/uL (ref 3.9–10.3)
lymph#: 3.4 10*3/uL — ABNORMAL HIGH (ref 0.9–3.3)

## 2014-02-04 MED ORDER — LIDOCAINE-PRILOCAINE 2.5-2.5 % EX CREA: 1.0000 "application " | TOPICAL_CREAM | CUTANEOUS | Status: AC | PRN

## 2014-02-04 MED ORDER — PROCHLORPERAZINE MALEATE 5 MG PO TABS: 5.0000 mg | ORAL_TABLET | Freq: Four times a day (QID) | ORAL | Status: AC | PRN

## 2014-02-04 NOTE — Telephone Encounter (Signed)
Pt confirmed labs/ov per 10/15 POF, will call IR to schedule to add port and sent msg to add chemo, gave pt AVS.... KJ

## 2014-02-04 NOTE — Progress Notes (Signed)
[facility] New Patient Consult   Referring MD: Arlester Marker  Taylor Alvarez 80 y.o.  09-10-1931    Reason for Referral: Metastatic pancreas cancer   HPI: She reports an isolated episode of bright red blood per rectum in June of 2015. She was seen for her yearly physical in July and the stool was Hemoccult positive. A colonoscopy 12/16/2013 revealed 2 parietal lesions in the splenic flexure and sigmoid colon. A biopsy returned consistent with metastatic adenocarcinoma. Malignant cells were noted in the submucosa that are positive for cytokeratin 7 and negative for CDX2, ER, PR, and gross cystic disease fluid protein.  She was referred for CTs of the chest, abdomen, and pelvis on 12/23/2013. Multiple low attenuation foci in the liver or suspicious for metastatic disease. An irregular mass was noted at the tail of the pancreas measuring 4.9 x 2.4 x 3 cm, highly suspicious for a pancreatic neoplasm. The mass was noted to extend into the medial margin of the spleen and into the splenic flexure of the colon. A soft tissue nodule versus irregular wall thickening was noted at the distal transverse colon.  She saw Dr. Eugenia Pancoast at [facility]. He did not recommend surgery and discussed Hospice care.  She saw [facility] 01/26/2014. He discussed treatment options in detail with Taylor Alvarez including standard systemic chemotherapy regimens and a clinical trial. She decided on treatment with gemcitabine and Abraxane.  Taylor Alvarez is referred for treatment closer to home.  Past Medical History  Diagnosis Date  . Celiac disease   . GERD (gastroesophageal reflux disease)   . Hypertension   . Anxiety   .  metastatic pancreas cancer   September 2015         .  G4 P3   .  Glaucoma   .  Herpes zoster at the left leg    . Chronic left foot drop and left leg weakness following back surgery   . Chronic urinary incontinence  Past Surgical History  Procedure Laterality Date  .     .       . Back surgery    . Partial hysterectomy with bladder tac    . Bilateral cataract surgery    . Ovarian cyst removal    . Tubal ligation    . Cesarean section    . Colonoscopy  06/06/2011    Procedure: COLONOSCOPY;  Surgeon: Rogene Houston, MD;  [facility];  Service: Endoscopy;  Laterality: N/A;  . Interstim implant placement  04/30/13,05/12/13  .  Left   . Colonoscopy N/A 12/16/2013    Procedure: COLONOSCOPY;  Surgeon: Rogene Houston, MD;  [facility];  Service: Endoscopy;  Laterality: N/A;  730-rescheduled 8/26 @ 100 Ann notified pt    Medications: Reviewed  Allergies:  Allergies  Allergen Reactions  . Cephalexin Diarrhea and Nausea And Vomiting  . Clindamycin/Lincomycin Nausea Only  . Nsaids Other (See Comments)    Bleeding of the colon.  [place]l [place] [Diltiazem Hcl] Rash    On face    Family history: 2 maternal first cousins had melanoma, a paternal aunt had ovarian cancer. Her paternal grandfather had prostate cancer. She has 2 sisters. One sister had breast cancer.  Social History:   She lives in [place]. 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 She drinks wine occasionally. No transfusion history. No risk factor for HIV or hepatitis.   History  Smoking status  . Former Smoker -- 0.50 packs/day  for 5 years  . Types: Cigarettes  . Quit date: 06/05/1953  Smokeless tobacco  . Never Used      ROS:   Positives include: Mild solid dysphagia, wheezing prior to beginning a diet for celiac disease, chronic left foot weakness following back surgery, intermittent dull left upper abdominal pain relieved with Vicodin, constipation  A complete ROS was otherwise negative.  Physical Exam:  Blood pressure 118/51, pulse 84, temperature 98.4 F (36.9 C), temperature source Oral, resp. rate 17, height 5\' 3"  (1.6 m), weight 150 lb 3.2 oz (68.13 kg), SpO2 97.00%.  HEENT: Oropharynx without visible mass, neck without mass Lungs: Clear  bilaterally Cardiac: Regular rate and rhythm Abdomen: No hepatosplenomegaly, no mass. Mild tenderness in the left upper abdomen  Vascular: No leg edema Lymph nodes: No cervical, supraclavicular, axillary, or inguinal nodes Neurologic: Alert and oriented, the motor exam appears intact in the upper and lower extremities Skin: No rash, lipoma at the left upper back Musculoskeletal: No spine tenderness   LAB:  CBC  Lab Results  Component Value Date   WBC 9.6 02/04/2014   HGB 10.4* 02/04/2014   HCT 33.5* 02/04/2014   MCV 81.1 02/04/2014   PLT 249 02/04/2014   NEUTROABS 5.0 02/04/2014     CMP      Component Value Date/Time   NA 141 02/04/2014 1527   K 4.1 02/04/2014 1527   CO2 27 02/04/2014 1527   GLUCOSE 103 02/04/2014 1527   BUN 17.3 02/04/2014 1527   CREATININE 0.8 02/04/2014 1527   CREATININE 0.74 12/22/2013 1214   CALCIUM 9.2 02/04/2014 1527   PROT 6.6 02/04/2014 1527   ALBUMIN 3.3* 02/04/2014 1527   AST 36* 02/04/2014 1527   ALT 24 02/04/2014 1527   ALKPHOS 132 02/04/2014 1527   BILITOT 0.27 02/04/2014 1527   CA 19-9 on 12/24/2013-1861  Imaging: CTs from 12/23/2013 reviewed    Assessment/Plan:   1. Metastatic pancreas cancer, pancreas tail mass with invasion of the colon, status post a colonoscopy 12/17/2011 confirming metastatic adenocarcinoma involving biopsies of a splenic flexure and sigmoid colon lesions  CTs 12/24/2011 consistent with a pancreas tail mass with invasion of the splenic flexure of the colon and liver metastases 2. Microcytic anemia-likely iron deficiency secondary to bleeding from the colon masses  3.   pain secondary to #1  4.   celiac disease  5.   left foot drop following back for  6.   chronic urinary incontinence-status post placement of a InterStim implant  7.   glaucoma   Disposition:   TaylorAlvarez has been diagnosed with metastatic pancreas cancer. I discussed the prognosis and treatment options with her today. We discussed  supportive care versus a trial of systemic chemotherapy. We specifically discussed single agent gemcitabine and gemcitabine/Abraxane. She had similar discussions with Dr. Berneice Gandy. She would like to proceed with gemcitabine/Abraxane.  I reviewed the potential toxicities associated with this regimen including the chance for nausea/vomiting, alopecia, an allergic reaction, and hematologic toxicity. We reviewed the fever, rash, and pneumonitis associated with gemcitabine. We discussed the neuropathy seen with Abraxane. She agrees to proceed. She will attend a chemotherapy teaching class today.  Taylor Alvarez will be referred for placement of a Port-A-Cath. The plan is to begin a first cycle of gemcitabine/Abraxane on 02/12/2014. She will be treated on an every 2 week schedule. We will follow her pain, the elevated CA 19-9 and CT as markers of disease response.  She will return for an office visit and cycle 2  gemcitabine/Abraxane 02/26/2014.  Approximately 50 minutes were spent with patient today. The majority of the time was used for counseling and coordination of care.  [place], [place] 02/04/2014, 5:16 PM

## 2014-02-04 NOTE — Progress Notes (Signed)
Checked in new pt with no financial concerns at this time.  Pt has 2 insurances so financial assistance may not be needed.  She has my card for any questions or concerns she may have in the future.

## 2014-02-04 NOTE — Progress Notes (Signed)
Presents today for new patient appointment for pancreatic cancer. She is accompanied by a friend, Einar Pheasant. Currently has mild sinus infection and declines flu vaccine at this time. On Amoxicillin for this currently.She appears much younger than her age of 30 years. Is independent and remains very active.

## 2014-02-05 ENCOUNTER — Telehealth: Payer: Self-pay | Admitting: *Deleted

## 2014-02-05 ENCOUNTER — Telehealth: Payer: Self-pay | Admitting: Nurse Practitioner

## 2014-02-05 LAB — CANCER ANTIGEN 19-9: CA 19-9: 6821.6 U/mL — ABNORMAL HIGH (ref <35.0)

## 2014-02-05 NOTE — Telephone Encounter (Signed)
Per staff message and POF I have scheduled appts. Advised scheduler of appts. JMW  

## 2014-02-05 NOTE — Telephone Encounter (Signed)
Lft msg for IR to schedule port no Wed per pt...Marland KitchenMarland KitchenMarland Kitchen KJ

## 2014-02-07 ENCOUNTER — Other Ambulatory Visit: Payer: Self-pay | Admitting: Oncology

## 2014-02-08 ENCOUNTER — Ambulatory Visit (HOSPITAL_COMMUNITY)
Admission: RE | Admit: 2014-02-08 | Discharge: 2014-02-08 | Disposition: A | Discharge status: Home / Self Care | Payer: Medicare Other | Source: Ambulatory Visit | Attending: Oncology | Admitting: Oncology

## 2014-02-08 ENCOUNTER — Encounter (HOSPITAL_COMMUNITY): Payer: Self-pay

## 2014-02-08 ENCOUNTER — Other Ambulatory Visit: Payer: Self-pay | Admitting: Radiology

## 2014-02-08 ENCOUNTER — Other Ambulatory Visit: Payer: Self-pay | Admitting: Oncology

## 2014-02-08 DIAGNOSIS — I10 Essential (primary) hypertension: Secondary | ICD-10-CM | POA: Diagnosis not present

## 2014-02-08 DIAGNOSIS — Z87891 Personal history of nicotine dependence: Secondary | ICD-10-CM | POA: Diagnosis not present

## 2014-02-08 DIAGNOSIS — C799 Secondary malignant neoplasm of unspecified site: Secondary | ICD-10-CM | POA: Diagnosis not present

## 2014-02-08 DIAGNOSIS — Z79899 Other long term (current) drug therapy: Secondary | ICD-10-CM | POA: Insufficient documentation

## 2014-02-08 DIAGNOSIS — C259 Malignant neoplasm of pancreas, unspecified: Secondary | ICD-10-CM | POA: Diagnosis not present

## 2014-02-08 DIAGNOSIS — Z452 Encounter for adjustment and management of vascular access device: Secondary | ICD-10-CM | POA: Insufficient documentation

## 2014-02-08 DIAGNOSIS — C252 Malignant neoplasm of tail of pancreas: Secondary | ICD-10-CM

## 2014-02-08 DIAGNOSIS — Z538 Procedure and treatment not carried out for other reasons: Secondary | ICD-10-CM | POA: Insufficient documentation

## 2014-02-08 DIAGNOSIS — Z79891 Long term (current) use of opiate analgesic: Secondary | ICD-10-CM | POA: Insufficient documentation

## 2014-02-08 LAB — CBC
HCT: 32.2 % — ABNORMAL LOW (ref 36.0–46.0)
HEMOGLOBIN: 10.3 g/dL — AB (ref 12.0–15.0)
MCH: 25.9 pg — ABNORMAL LOW (ref 26.0–34.0)
MCHC: 32 g/dL (ref 30.0–36.0)
MCV: 81.1 fL (ref 78.0–100.0)
Platelets: 241 10*3/uL (ref 150–400)
RBC: 3.97 MIL/uL (ref 3.87–5.11)
RDW: 14.4 % (ref 11.5–15.5)
WBC: 9.5 10*3/uL (ref 4.0–10.5)

## 2014-02-08 LAB — PROTIME-INR
INR: 1.08 (ref 0.00–1.49)
Prothrombin Time: 14.2 seconds (ref 11.6–15.2)

## 2014-02-08 LAB — APTT: APTT: 26 s (ref 24–37)

## 2014-02-08 MED ORDER — VANCOMYCIN HCL 10 G IV SOLR: 1000.0000 mg | INTRAVENOUS | Status: DC

## 2014-02-08 MED ORDER — SODIUM CHLORIDE 0.9 % IV SOLN: INTRAVENOUS | Status: AC

## 2014-02-08 NOTE — Discharge Instructions (Signed)
RETURN TO RADIOLOGY BEFORE 0745 02-09-14 NOTHING TO EAT OR DRINK AFTER MIDNIGHT

## 2014-02-08 NOTE — H&P (Signed)
Patient seen.  For port placement today.

## 2014-02-08 NOTE — H&P (Signed)
Chief Complaint: "I'm getting a port a cath"  Referring Physician(s): Sherrill,Gary B  History of Present Illness: Taylor Alvarez is a 80 y.o. female with history of metastatic pancreatic carcinoma who presents today for port a cath placement for chemotherapy.  Past Medical History  Diagnosis Date  . Celiac disease   . GERD (gastroesophageal reflux disease)   . Hypertension   . Anxiety   . History of fall 2014    at [facility] store    Past Surgical History  Procedure Laterality Date  . Urge incontinence    . Hypertension    . Back surgery    . Partial hysterectomy with bladder tac    . Bilateral cataract surgery    . Ovarian cyst removal    . Tubal ligation    . Cesarean section    . Colonoscopy  06/06/2011    Procedure: COLONOSCOPY;  Surgeon: Rogene Houston, MD;  [facility];  Service: Endoscopy;  Laterality: N/A;  . Interstim implant placement  04/30/13,05/12/13  . Wrist sprain Left   . Colonoscopy N/A 12/16/2013    Procedure: COLONOSCOPY;  Surgeon: Rogene Houston, MD;  [facility];  Service: Endoscopy;  Laterality: N/A;  730-rescheduled 8/26 @ 100 Ann notified pt    Allergies: Cephalexin; Clindamycin/lincomycin; Nsaids; and Cardizem  Medications: Prior to Admission medications   Medication Sig Start Date End Date Taking? Authorizing Provider  acetaminophen (TYLENOL) 500 MG tablet Take 500 mg by mouth every 6 (six) hours as needed. For pain   Yes Historical Provider, MD  amoxicillin (AMOXIL) 500 MG capsule Take 500 mg by mouth 3 (three) times daily. X 7 days 02/03/14  Yes Historical Provider, MD  brimonidine (ALPHAGAN P) 0.1 % SOLN 2 drops 2 (two) times daily.    Yes Historical Provider, MD  calcium carbonate (OS-CAL) 600 MG TABS Take 600 mg by mouth daily.    Yes Historical Provider, MD  Cholecalciferol (VITAMIN D) 2000 UNITS CAPS Take 2,000 Units by mouth daily.   Yes Historical Provider, MD  docusate sodium (COLACE) 100 MG capsule Take  100 mg by mouth daily as needed.    Yes Historical Provider, MD  ferrous sulfate 325 (65 FE) MG tablet Take 325 mg by mouth daily with breakfast. 02/05/14  Yes Historical Provider, MD  HYDROcodone-acetaminophen (NORCO/VICODIN) 5-325 MG per tablet Take 1 tablet by mouth every 6 (six) hours as needed for moderate pain. 01/05/14  Yes Rogene Houston, MD  Lactobacillus (FLORAJEN ACIDOPHILUS) CAPS Take 1 capsule by mouth daily.    Yes Historical Provider, MD  Loratadine (CLARITIN) 10 MG CAPS Take 10 mg by mouth daily.   Yes Historical Provider, MD  MAGNESIUM CITRATE PO Take 400 mg by mouth daily as needed.    Yes Historical Provider, MD  Multiple Vitamins-Minerals (WOMENS 50+ MULTI VITAMIN/MIN PO) Take by mouth daily.    Yes Historical Provider, MD  nitrofurantoin (MACRODANTIN) 100 MG capsule Take 100 mg by mouth at bedtime.   Yes Historical Provider, MD  sertraline (ZOLOFT) 50 MG tablet Take 50 mg by mouth daily.     Yes Historical Provider, MD  lidocaine-prilocaine (EMLA) cream Apply 1 application topically as needed. Apply to [facility] site 1-2 hours prior to stick and cover with plastic wrap 02/04/14   Ladell Pier, MD  prochlorperazine (COMPAZINE) 5 MG tablet Take 1-2 tablets (5-10 mg total) by mouth every 6 (six) hours as needed for nausea or vomiting. 02/04/14   Izola Price  Benay Spice, MD    Family History  Problem Relation Age of Onset  . Anesthesia problems Neg Hx   . Hypotension Neg Hx   . Malignant hyperthermia Neg Hx   . Pseudochol deficiency Neg Hx     History   Social History  . Marital Status: Divorced    Spouse Name: N/A    Number of Children: N/A  . Years of Education: N/A   Social History Main Topics  . Smoking status: Former Smoker -- 0.50 packs/day for 5 years    Types: Cigarettes    Quit date: 06/05/1953  . Smokeless tobacco: Never Used  . Alcohol Use: No  . Drug Use: No  . Sexual Activity: Yes    Birth Control/ Protection: Post-menopausal   Other Topics Concern  . None     Social History Narrative   Divorced-lives alone in house with #3 flights of steps   Has #3 sons and #6 grandchildren   Retired Psychologist, prison and probation services & taught in [employer], active in church, likes cooking and travel.   Lives in [place], [place]   Currently has medical student renting a room from her and has a renter in house behind her home.         Review of Systems  Constitutional: Negative for fever and chills.       Occ hot flashes  HENT: Positive for congestion.   Respiratory: Negative for cough and shortness of breath.   Cardiovascular: Negative for chest pain.  Gastrointestinal: Positive for abdominal pain. Negative for nausea and vomiting.  Genitourinary: Negative for dysuria and hematuria.  Musculoskeletal: Negative for back pain.  Neurological: Negative for headaches.  Hematological: Does not bruise/bleed easily.    Vital Signs: BP 129/49  Pulse 71  Temp(Src) 98.2 F (36.8 C) (Oral)  Resp 16  Ht 5\' 3"  (1.6 m)  Wt 150 lb 3.2 oz (68.13 kg)  BMI 26.61 kg/m2  SpO2 100%  Physical Exam  Constitutional: She is oriented to person, place, and time. She appears well-developed and well-nourished.  Cardiovascular: Normal rate and regular rhythm.   Pulmonary/Chest: Effort normal and breath sounds normal.  Abdominal: Soft. Bowel sounds are normal. There is tenderness.  Musculoskeletal: Normal range of motion. She exhibits no edema.  Neurological: She is alert and oriented to person, place, and time.    Imaging: No results found.  Labs:  CBC:  Recent Labs  02/04/14 1527 02/08/14 1315  WBC 9.6 9.5  HGB 10.4* 10.3*  HCT 33.5* 32.2*  PLT 249 241    COAGS:  Recent Labs  02/08/14 1315  INR 1.08  APTT 26    BMP:  Recent Labs  12/22/13 1214 02/04/14 1527  NA  --  141  K  --  4.1  CO2  --  27  GLUCOSE  --  103  BUN  --  17.3  CALCIUM  --  9.2  CREATININE 0.74 0.8    LIVER FUNCTION TESTS:  Recent Labs  02/04/14 1527  BILITOT  0.27  AST 36*  ALT 24  ALKPHOS 132  PROT 6.6  ALBUMIN 3.3*    TUMOR MARKERS:  Recent Labs  12/24/13 1730 02/04/14 1527  CA199 1861.3* 6821.6*    Assessment and Plan: Taylor Alvarez is a 80 y.o. female with history of metastatic pancreatic carcinoma who presents today for port a cath placement for chemotherapy. Details/risks of procedure d/w pt with her understanding and consent.  Signed: Autumn Messing 02/08/2014, 1:49 PM

## 2014-02-09 ENCOUNTER — Ambulatory Visit (HOSPITAL_COMMUNITY)
Admission: RE | Admit: 2014-02-09 | Discharge: 2014-02-09 | Disposition: A | Discharge status: Home / Self Care | Payer: Medicare Other | Source: Ambulatory Visit | Attending: Oncology | Admitting: Oncology

## 2014-02-09 ENCOUNTER — Encounter (HOSPITAL_COMMUNITY): Payer: Self-pay

## 2014-02-09 DIAGNOSIS — C252 Malignant neoplasm of tail of pancreas: Secondary | ICD-10-CM | POA: Diagnosis not present

## 2014-02-09 DIAGNOSIS — Z452 Encounter for adjustment and management of vascular access device: Secondary | ICD-10-CM | POA: Insufficient documentation

## 2014-02-09 MED ORDER — MIDAZOLAM HCL 2 MG/2ML IJ SOLN
INTRAMUSCULAR | Status: AC | PRN
  Administered 2014-02-09 (×2): 1 mg via INTRAVENOUS

## 2014-02-09 MED ORDER — LIDOCAINE HCL 1 % IJ SOLN
INTRAMUSCULAR | Status: AC
  Filled 2014-02-09: qty 20

## 2014-02-09 MED ORDER — FENTANYL CITRATE 0.05 MG/ML IJ SOLN
INTRAMUSCULAR | Status: AC | PRN
  Administered 2014-02-09: 100 ug via INTRAVENOUS

## 2014-02-09 MED ORDER — HEPARIN SOD (PORK) LOCK FLUSH 100 UNIT/ML IV SOLN
500.0000 [IU] | Freq: Once | INTRAVENOUS | Status: AC
  Administered 2014-02-09: 500 [IU] via INTRAVENOUS

## 2014-02-09 MED ORDER — MIDAZOLAM HCL 2 MG/2ML IJ SOLN
INTRAMUSCULAR | Status: AC
  Filled 2014-02-09: qty 4

## 2014-02-09 MED ORDER — FENTANYL CITRATE 0.05 MG/ML IJ SOLN
INTRAMUSCULAR | Status: AC
  Filled 2014-02-09: qty 4

## 2014-02-09 MED ORDER — VANCOMYCIN HCL IN DEXTROSE 1-5 GM/200ML-% IV SOLN
1000.0000 mg | INTRAVENOUS | Status: AC
  Administered 2014-02-09: 1000 mg via INTRAVENOUS
  Filled 2014-02-09: qty 200

## 2014-02-09 MED ORDER — HEPARIN SOD (PORK) LOCK FLUSH 100 UNIT/ML IV SOLN
INTRAVENOUS | Status: AC
  Filled 2014-02-09: qty 5

## 2014-02-09 MED ORDER — SODIUM CHLORIDE 0.9 % IV SOLN
INTRAVENOUS | Status: DC
  Administered 2014-02-09: 09:00:00 via INTRAVENOUS

## 2014-02-09 NOTE — Procedures (Signed)
R IJ Port cathter placement with US and fluoroscopy No complication No blood loss. See complete dictation in [facility].  

## 2014-02-09 NOTE — Discharge Instructions (Signed)
Implanted Port Home Guide °An implanted port is a type of central line that is placed under the skin. Central lines are used to provide IV access when treatment or nutrition needs to be given through a person's veins. Implanted ports are used for long-term IV access. An implanted port may be placed because:  °· You need IV medicine that would be irritating to the small veins in your hands or arms.   °· You need long-term IV medicines, such as antibiotics.   °· You need IV nutrition for a long period.   °· You need frequent blood draws for lab tests.   °· You need dialysis.   °Implanted ports are usually placed in the chest area, but they can also be placed in the upper arm, the abdomen, or the leg. An implanted port has two main parts:  °· Reservoir. The reservoir is round and will appear as a small, raised area under your skin. The reservoir is the part where a needle is inserted to give medicines or draw blood.   °· Catheter. The catheter is a thin, flexible tube that extends from the reservoir. The catheter is placed into a large vein. Medicine that is inserted into the reservoir goes into the catheter and then into the vein.   °HOW WILL I CARE FOR MY INCISION SITE? °Do not get the incision site wet. Bathe or shower as directed by your health care provider.  °HOW IS MY PORT ACCESSED? °Special steps must be taken to access the port:  °· Before the port is accessed, a numbing cream can be placed on the skin. This helps numb the skin over the port site.   °· Your health care provider uses a sterile technique to access the port. °· Your health care provider must put on a mask and sterile gloves. °· The skin over your port is cleaned carefully with an antiseptic and allowed to dry. °· The port is gently pinched between sterile gloves, and a needle is inserted into the port. °· Only "non-coring" port needles should be used to access the port. Once the port is accessed, a blood return should be checked. This helps  ensure that the port is in the vein and is not clogged.   °· If your port needs to remain accessed for a constant infusion, a clear (transparent) bandage will be placed over the needle site. The bandage and needle will need to be changed every week, or as directed by your health care provider.   °· Keep the bandage covering the needle clean and dry. Do not get it wet. Follow your health care provider's instructions on how to take a shower or bath while the port is accessed.   °· If your port does not need to stay accessed, no bandage is needed over the port.   °WHAT IS FLUSHING? °Flushing helps keep the port from getting clogged. Follow your health care provider's instructions on how and when to flush the port. Ports are usually flushed with saline solution or a medicine called heparin. The need for flushing will depend on how the port is used.  °· If the port is used for intermittent medicines or blood draws, the port will need to be flushed:   °· After medicines have been given.   °· After blood has been drawn.   °· As part of routine maintenance.   °· If a constant infusion is running, the port may not need to be flushed.   °HOW LONG WILL MY PORT STAY IMPLANTED? °The port can stay in for as long as your health care   provider thinks it is needed. When it is time for the port to come out, surgery will be done to remove it. The procedure is similar to the one performed when the port was put in.  WHEN SHOULD I SEEK IMMEDIATE MEDICAL CARE? When you have an implanted port, you should seek immediate medical care if:   You notice a bad smell coming from the incision site.   You have swelling, redness, or drainage at the incision site.   You have more swelling or pain at the port site or the surrounding area.   You have a fever that is not controlled with medicine. Document Released: 04/09/2005 Document Revised: 01/28/2013 Document Reviewed: 12/15/2012 [facility] Patient Information 2015 [place], [place]. This  information is not intended to replace advice given to you by your health care provider. Make sure you discuss any questions you have with your health care provider. Leave dressing on for 24 hours.  You may shower after 24 hours.  Please remove the dressing before you shower.    Implanted Port Insertion, Care After Refer to this sheet in the next few weeks. These instructions provide you with information on caring for yourself after your procedure. Your health care provider may also give you more specific instructions. Your treatment has been planned according to current medical practices, but problems sometimes occur. Call your health care provider if you have any problems or questions after your procedure. WHAT TO EXPECT AFTER THE PROCEDURE After your procedure, it is typical to have the following:   Discomfort at the port insertion site. Ice packs to the area will help.  Bruising on the skin over the port. This will subside in 3-4 days. HOME CARE INSTRUCTIONS  After your port is placed, you will get a manufacturer's information card. The card has information about your port. Keep this card with you at all times.   Know what kind of port you have. There are many types of ports available.   Wear a medical alert bracelet in case of an emergency. This can help alert health care workers that you have a port.   The port can stay in for as long as your health care provider believes it is necessary.   A home health care nurse may give medicines and take care of the port.   You or a family member can get special training and directions for giving medicine and taking care of the port at home.  SEEK MEDICAL CARE IF:   Your port does not flush or you are unable to get a blood return.   You have a fever or chills. SEEK IMMEDIATE MEDICAL CARE IF:  You have new fluid or pus coming from your incision.   You notice a bad smell coming from your incision site.   You have swelling, pain,  or more redness at the incision or port site.   You have chest pain or shortness of breath. Document Released: 01/28/2013 Document Revised: 04/14/2013 Document Reviewed: 01/28/2013 [facility] Patient Information 2015 [place], [place]. This information is not intended to replace advice given to you by your health care provider. Make sure you discuss any questions you have with your health care provider. Conscious Sedation, Adult, Care After Refer to this sheet in the next few weeks. These instructions provide you with information on caring for yourself after your procedure. Your health care provider may also give you more specific instructions. Your treatment has been planned according to current medical practices, but problems sometimes occur. Call  your health care provider if you have any problems or questions after your procedure. WHAT TO EXPECT AFTER THE PROCEDURE  After your procedure:  You may feel sleepy, clumsy, and have poor balance for several hours.  Vomiting may occur if you eat too soon after the procedure. HOME CARE INSTRUCTIONS  Do not participate in any activities where you could become injured for at least 24 hours. Do not:  Drive.  Swim.  Ride a bicycle.  Operate heavy machinery.  Cook.  Use power tools.  Climb ladders.  Work from a high place.  Do not make important decisions or sign legal documents until you are improved.  If you vomit, drink water, juice, or soup when you can drink without vomiting. Make sure you have little or no nausea before eating solid foods.  Only take over-the-counter or prescription medicines for pain, discomfort, or fever as directed by your health care provider.  Make sure you and your family fully understand everything about the medicines given to you, including what side effects may occur.  You should not drink alcohol, take sleeping pills, or take medicines that cause drowsiness for at least 24 hours.  If you smoke, do not  smoke without supervision.  If you are feeling better, you may resume normal activities 24 hours after you were sedated.  Keep all appointments with your health care provider. SEEK MEDICAL CARE IF:  Your skin is pale or bluish in color.  You continue to feel nauseous or vomit.  Your pain is getting worse and is not helped by medicine.  You have bleeding or swelling.  You are still sleepy or feeling clumsy after 24 hours. SEEK IMMEDIATE MEDICAL CARE IF:  You develop a rash.  You have difficulty breathing.  You develop any type of allergic problem.  You have a fever. MAKE SURE YOU:  Understand these instructions.  Will watch your condition.  Will get help right away if you are not doing well or get worse. Document Released: 01/28/2013 Document Reviewed: 01/28/2013 [facility] Patient Information 2015 [place], [place]. This information is not intended to replace advice given to you by your health care provider. Make sure you discuss any questions you have with your health care provider.

## 2014-02-09 NOTE — Progress Notes (Signed)
Patient ID: Taylor Alvarez, female   DOB: 02-Mar-1932, 80 y.o.   MRN: [id number] Pt presents today for port a cath placement. See H&P from 02/08/14 for additional details. Case was rescheduled for today due to inadequate humidity levels in IR suite 10/19. Pt's clinical status has not changed in past 24 hours. She is awake/alert; chest- CTA bilat; heart- RRR. Details/risks of procedure d/w pt with her understanding and consent.   Filed Vitals:   02/09/14 0831  BP: 140/69  Pulse: 59  Temp: 98.6 F (37 C)  TempSrc: Oral  Resp: 16  SpO2: 100%   Results for orders placed during the hospital encounter of 02/08/14  CBC      Result Value Ref Range   WBC 9.5  4.0 - 10.5 K/uL   RBC 3.97  3.87 - 5.11 MIL/uL   Hemoglobin 10.3 (*) 12.0 - 15.0 g/dL   HCT 32.2 (*) 36.0 - 46.0 %   MCV 81.1  78.0 - 100.0 fL   MCH 25.9 (*) 26.0 - 34.0 pg   MCHC 32.0  30.0 - 36.0 g/dL   RDW 14.4  11.5 - 15.5 %   Platelets 241  150 - 400 K/uL  PROTIME-INR      Result Value Ref Range   Prothrombin Time 14.2  11.6 - 15.2 seconds   INR 1.08  0.00 - 1.49  APTT      Result Value Ref Range   aPTT 26  24 - 37 seconds    Rowe Robert, PAC

## 2014-02-12 ENCOUNTER — Ambulatory Visit (HOSPITAL_BASED_OUTPATIENT_CLINIC_OR_DEPARTMENT_OTHER): Payer: Medicare Other

## 2014-02-12 VITALS — BP 143/50 | HR 70 | Temp 98.1°F

## 2014-02-12 DIAGNOSIS — Z5111 Encounter for antineoplastic chemotherapy: Secondary | ICD-10-CM

## 2014-02-12 DIAGNOSIS — C252 Malignant neoplasm of tail of pancreas: Secondary | ICD-10-CM

## 2014-02-12 DIAGNOSIS — C785 Secondary malignant neoplasm of large intestine and rectum: Secondary | ICD-10-CM

## 2014-02-12 MED ORDER — ONDANSETRON 8 MG/50ML IVPB (CHCC)
8.0000 mg | Freq: Once | INTRAVENOUS | Status: AC
  Administered 2014-02-12: 8 mg via INTRAVENOUS

## 2014-02-12 MED ORDER — SODIUM CHLORIDE 0.9 % IJ SOLN
10.0000 mL | INTRAMUSCULAR | Status: DC | PRN
  Administered 2014-02-12: 10 mL
  Filled 2014-02-12: qty 10

## 2014-02-12 MED ORDER — ONDANSETRON 8 MG/NS 50 ML IVPB
INTRAVENOUS | Status: AC
  Filled 2014-02-12: qty 8

## 2014-02-12 MED ORDER — SODIUM CHLORIDE 0.9 % IV SOLN
800.0000 mg/m2 | Freq: Once | INTRAVENOUS | Status: AC
  Administered 2014-02-12: 1406 mg via INTRAVENOUS
  Filled 2014-02-12: qty 36.98

## 2014-02-12 MED ORDER — PACLITAXEL PROTEIN-BOUND CHEMO INJECTION 100 MG
100.0000 mg/m2 | Freq: Once | INTRAVENOUS | Status: AC
  Administered 2014-02-12: 175 mg via INTRAVENOUS
  Filled 2014-02-12: qty 35

## 2014-02-12 MED ORDER — SODIUM CHLORIDE 0.9 % IV SOLN
Freq: Once | INTRAVENOUS | Status: AC
  Administered 2014-02-12: 13:00:00 via INTRAVENOUS

## 2014-02-12 MED ORDER — DEXAMETHASONE SODIUM PHOSPHATE 10 MG/ML IJ SOLN
10.0000 mg | Freq: Once | INTRAMUSCULAR | Status: AC
  Administered 2014-02-12: 10 mg via INTRAVENOUS

## 2014-02-12 MED ORDER — DEXAMETHASONE SODIUM PHOSPHATE 10 MG/ML IJ SOLN
INTRAMUSCULAR | Status: AC
  Filled 2014-02-12: qty 1

## 2014-02-12 MED ORDER — HEPARIN SOD (PORK) LOCK FLUSH 100 UNIT/ML IV SOLN
500.0000 [IU] | Freq: Once | INTRAVENOUS | Status: AC | PRN
  Administered 2014-02-12: 500 [IU]
  Filled 2014-02-12: qty 5

## 2014-02-12 NOTE — Patient Instructions (Signed)
[facility] Discharge Instructions for Patients Receiving Chemotherapy  Today you received the following chemotherapy agents Gemzar/Abraxane.  To help prevent nausea and vomiting after your treatment, we encourage you to take your nausea medication as prescribed.   If you develop nausea and vomiting that is not controlled by your nausea medication, call the clinic.   BELOW ARE SYMPTOMS THAT SHOULD BE REPORTED IMMEDIATELY:  *FEVER GREATER THAN 100.5 F  *CHILLS WITH OR WITHOUT FEVER  NAUSEA AND VOMITING THAT IS NOT CONTROLLED WITH YOUR NAUSEA MEDICATION  *UNUSUAL SHORTNESS OF BREATH  *UNUSUAL BRUISING OR BLEEDING  TENDERNESS IN MOUTH AND THROAT WITH OR WITHOUT PRESENCE OF ULCERS  *URINARY PROBLEMS  *BOWEL PROBLEMS  UNUSUAL RASH Items with * indicate a potential emergency and should be followed up as soon as possible.  Feel free to call the clinic you have any questions or concerns. The clinic phone number is [phone].

## 2014-02-15 ENCOUNTER — Telehealth: Payer: Self-pay | Admitting: *Deleted

## 2014-02-15 NOTE — Telephone Encounter (Signed)
DISCUSSED WITH PT. THE IMPORTANCE OF EATING SMALL, FREQUENT, AND NUTRITIOUS MEALS. ENCOURAGED PT. TO FORCE FLUIDS TO 64 OUNCES IN A 24 HOUR PERIOD. NO DIARRHEA OR CONSTIPATION. NO MOUTH ISSUES. REMINDED PT. TO TAKE REST PERIODS DURING THE DAY. SHE WILL CALL THIS OFFICE IF THERE ARE ANY CONCERNS OR PROBLEMS OR AFTER HOURS TO CONTACT THE ON CALL PHYSICIAN 24/7.

## 2014-02-26 ENCOUNTER — Telehealth: Payer: Self-pay | Admitting: *Deleted

## 2014-02-26 ENCOUNTER — Encounter: Payer: Self-pay | Admitting: Oncology

## 2014-02-26 ENCOUNTER — Other Ambulatory Visit (HOSPITAL_BASED_OUTPATIENT_CLINIC_OR_DEPARTMENT_OTHER): Payer: Medicare Other

## 2014-02-26 ENCOUNTER — Ambulatory Visit (HOSPITAL_BASED_OUTPATIENT_CLINIC_OR_DEPARTMENT_OTHER): Payer: Medicare Other

## 2014-02-26 ENCOUNTER — Ambulatory Visit (HOSPITAL_BASED_OUTPATIENT_CLINIC_OR_DEPARTMENT_OTHER): Payer: Medicare Other | Admitting: Oncology

## 2014-02-26 ENCOUNTER — Telehealth: Payer: Self-pay | Admitting: Oncology

## 2014-02-26 VITALS — BP 143/58 | HR 67 | Temp 98.4°F | Resp 18 | Ht 63.0 in | Wt 204.7 lb

## 2014-02-26 DIAGNOSIS — C252 Malignant neoplasm of tail of pancreas: Secondary | ICD-10-CM

## 2014-02-26 DIAGNOSIS — K5900 Constipation, unspecified: Secondary | ICD-10-CM

## 2014-02-26 DIAGNOSIS — D509 Iron deficiency anemia, unspecified: Secondary | ICD-10-CM

## 2014-02-26 DIAGNOSIS — Z5111 Encounter for antineoplastic chemotherapy: Secondary | ICD-10-CM

## 2014-02-26 DIAGNOSIS — K5909 Other constipation: Secondary | ICD-10-CM

## 2014-02-26 DIAGNOSIS — C7989 Secondary malignant neoplasm of other specified sites: Secondary | ICD-10-CM

## 2014-02-26 LAB — COMPREHENSIVE METABOLIC PANEL (CC13)
ALBUMIN: 3.2 g/dL — AB (ref 3.5–5.0)
ALK PHOS: 195 U/L — AB (ref 40–150)
ALT: 46 U/L (ref 0–55)
AST: 45 U/L — AB (ref 5–34)
Anion Gap: 8 mEq/L (ref 3–11)
BUN: 11.4 mg/dL (ref 7.0–26.0)
CO2: 26 mEq/L (ref 22–29)
CREATININE: 0.8 mg/dL (ref 0.6–1.1)
Calcium: 9 mg/dL (ref 8.4–10.4)
Chloride: 106 mEq/L (ref 98–109)
Glucose: 89 mg/dl (ref 70–140)
Potassium: 4.3 mEq/L (ref 3.5–5.1)
Sodium: 141 mEq/L (ref 136–145)
Total Bilirubin: 0.4 mg/dL (ref 0.20–1.20)
Total Protein: 6.4 g/dL (ref 6.4–8.3)

## 2014-02-26 LAB — CBC WITH DIFFERENTIAL/PLATELET
BASO%: 0.8 % (ref 0.0–2.0)
BASOS ABS: 0.1 10*3/uL (ref 0.0–0.1)
EOS ABS: 0.2 10*3/uL (ref 0.0–0.5)
EOS%: 2.9 % (ref 0.0–7.0)
HCT: 31.5 % — ABNORMAL LOW (ref 34.8–46.6)
HGB: 9.9 g/dL — ABNORMAL LOW (ref 11.6–15.9)
LYMPH#: 1.9 10*3/uL (ref 0.9–3.3)
LYMPH%: 22.4 % (ref 14.0–49.7)
MCH: 25.6 pg (ref 25.1–34.0)
MCHC: 31.4 g/dL — ABNORMAL LOW (ref 31.5–36.0)
MCV: 81.6 fL (ref 79.5–101.0)
MONO#: 1 10*3/uL — ABNORMAL HIGH (ref 0.1–0.9)
MONO%: 11.4 % (ref 0.0–14.0)
NEUT%: 62.5 % (ref 38.4–76.8)
NEUTROS ABS: 5.2 10*3/uL (ref 1.5–6.5)
Platelets: 306 10*3/uL (ref 145–400)
RBC: 3.86 10*6/uL (ref 3.70–5.45)
RDW: 15.5 % — AB (ref 11.2–14.5)
WBC: 8.4 10*3/uL (ref 3.9–10.3)

## 2014-02-26 MED ORDER — ONDANSETRON 8 MG/NS 50 ML IVPB
INTRAVENOUS | Status: AC
  Filled 2014-02-26: qty 8

## 2014-02-26 MED ORDER — HEPARIN SOD (PORK) LOCK FLUSH 100 UNIT/ML IV SOLN
500.0000 [IU] | Freq: Once | INTRAVENOUS | Status: AC | PRN
  Administered 2014-02-26: 500 [IU]
  Filled 2014-02-26: qty 5

## 2014-02-26 MED ORDER — PACLITAXEL PROTEIN-BOUND CHEMO INJECTION 100 MG
100.0000 mg/m2 | Freq: Once | INTRAVENOUS | Status: AC
  Administered 2014-02-26: 175 mg via INTRAVENOUS
  Filled 2014-02-26: qty 35

## 2014-02-26 MED ORDER — DEXAMETHASONE SODIUM PHOSPHATE 10 MG/ML IJ SOLN
10.0000 mg | Freq: Once | INTRAMUSCULAR | Status: AC
  Administered 2014-02-26: 10 mg via INTRAVENOUS

## 2014-02-26 MED ORDER — SODIUM CHLORIDE 0.9 % IJ SOLN
10.0000 mL | INTRAMUSCULAR | Status: DC | PRN
  Administered 2014-02-26: 10 mL
  Filled 2014-02-26: qty 10

## 2014-02-26 MED ORDER — SODIUM CHLORIDE 0.9 % IV SOLN
800.0000 mg/m2 | Freq: Once | INTRAVENOUS | Status: AC
  Administered 2014-02-26: 1406 mg via INTRAVENOUS
  Filled 2014-02-26: qty 36.98

## 2014-02-26 MED ORDER — DEXAMETHASONE SODIUM PHOSPHATE 10 MG/ML IJ SOLN
INTRAMUSCULAR | Status: AC
  Filled 2014-02-26: qty 1

## 2014-02-26 MED ORDER — ONDANSETRON 8 MG/50ML IVPB (CHCC)
8.0000 mg | Freq: Once | INTRAVENOUS | Status: AC
  Administered 2014-02-26: 8 mg via INTRAVENOUS

## 2014-02-26 MED ORDER — SODIUM CHLORIDE 0.9 % IV SOLN
Freq: Once | INTRAVENOUS | Status: AC
  Administered 2014-02-26: 11:00:00 via INTRAVENOUS

## 2014-02-26 NOTE — Telephone Encounter (Signed)
Per staff message and POF I have scheduled appts. Advised scheduler of appts. JMW  

## 2014-02-26 NOTE — Telephone Encounter (Signed)
Gave avs & cal for Nov. Sent mess to sch tx. °

## 2014-02-26 NOTE — Patient Instructions (Signed)
[facility] Discharge Instructions for Patients Receiving Chemotherapy  Today you received the following chemotherapy agents Abraxane/Gemzar.   To help prevent nausea and vomiting after your treatment, we encourage you to take your nausea medication as directed.    If you develop nausea and vomiting that is not controlled by your nausea medication, call the clinic.   BELOW ARE SYMPTOMS THAT SHOULD BE REPORTED IMMEDIATELY:  *FEVER GREATER THAN 100.5 F  *CHILLS WITH OR WITHOUT FEVER  NAUSEA AND VOMITING THAT IS NOT CONTROLLED WITH YOUR NAUSEA MEDICATION  *UNUSUAL SHORTNESS OF BREATH  *UNUSUAL BRUISING OR BLEEDING  TENDERNESS IN MOUTH AND THROAT WITH OR WITHOUT PRESENCE OF ULCERS  *URINARY PROBLEMS  *BOWEL PROBLEMS  UNUSUAL RASH Items with * indicate a potential emergency and should be followed up as soon as possible.  Feel free to call the clinic you have any questions or concerns. The clinic phone number is [phone].    

## 2014-02-26 NOTE — Progress Notes (Signed)
[facility] Follow-up visit   Referring MD: Hortence Charter 80 y.o.  January 16, 1932    Diagnosis: Metastatic pancreatic cancer  HPI:  [name] returns for routine followup with her niece. She received her first cycle of chemotherapy with Gemzar and Abraxane approximately 2 weeks ago. States that she tolerated it well overall with the exception of increased fatigue about 3 days following her chemotherapy which has now improved. She also had some intermittent nausea without any vomiting. She used her Compazine which was effective. Her appetite has remained good. Her weight is stable.she denies fevers, chills, chest pain, shortness of breath, abdominal pain, diarrhea. She reports constipation. She states that she use Colace twice a day but has not been using this routinely. She has uses magnesium citrate tablets but does not take this every day. Denies bleeding.    Past Medical History  Diagnosis Date  . Celiac disease   . GERD (gastroesophageal reflux disease)   . Hypertension   . Anxiety   .  metastatic pancreas cancer   September 2015         .  G4 P3   .  Glaucoma   .  Herpes zoster at the left leg    . Chronic left foot drop and left leg weakness following back surgery   . Chronic urinary incontinence  Past Surgical History  Procedure Laterality Date  .     .     . Back surgery    . Partial hysterectomy with bladder tac    . Bilateral cataract surgery    . Ovarian cyst removal    . Tubal ligation    . Cesarean section    . Colonoscopy  06/06/2011    Procedure: COLONOSCOPY;  Surgeon: Rogene Houston, MD;  Location: [facility];  Service: Endoscopy;  Laterality: N/A;  . Interstim implant placement  04/30/13,05/12/13  .  Left   . Colonoscopy N/A 12/16/2013    Procedure: COLONOSCOPY;  Surgeon: Rogene Houston, MD;  Location: [facility];  Service: Endoscopy;  Laterality: N/A;  730-rescheduled 8/26 @ 100 Ann notified pt    Medications:  Reviewed  Allergies:  Allergies  Allergen Reactions  . Cephalexin Diarrhea and Nausea And Vomiting  . Clindamycin/Lincomycin Nausea Only  . Nsaids Other (See Comments)    Bleeding of the colon.  [place] [Diltiazem Hcl] Rash    On face    Family history: 2 maternal first cousins had melanoma, a paternal aunt had ovarian cancer. Her paternal grandfather had prostate cancer. She has 2 sisters. One sister had breast cancer.  Social History:   She lives in [place]. 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 She drinks wine occasionally. No transfusion history. No risk factor for HIV or hepatitis.   History  Smoking status  . Former Smoker -- 0.50 packs/day for 5 years  . Types: Cigarettes  . Quit date: 06/05/1953  Smokeless tobacco  . Never Used      ROS:   10 point review of systems is negative except as noted in history of present illness.  Physical Exam:  Blood pressure 143/58, pulse 67, temperature 98.4 F (36.9 C), temperature source Oral, resp. rate 18, height 5\' 3"  (1.6 m), weight 204 lb 11.2 oz (92.851 kg).  HEENT: Oropharynx without visible mass, neck without mass Lungs: Clear bilaterally Cardiac: Regular rate and rhythm Abdomen: No hepatosplenomegaly, no mass. Mild tenderness in the left upper abdomen  Vascular:  No leg edema Lymph nodes: No cervical, supraclavicular, axillary, or inguinal nodes Neurologic: Alert and oriented, the motor exam appears intact in the upper and lower extremities Skin: No rash, lipoma at the left upper back Musculoskeletal: No spine tenderness   LAB:  CBC  Lab Results  Component Value Date   WBC 8.4 02/26/2014   HGB 9.9* 02/26/2014   HCT 31.5* 02/26/2014   MCV 81.6 02/26/2014   PLT 306 02/26/2014   NEUTROABS 5.2 02/26/2014     CMP      Component Value Date/Time   NA 141 02/26/2014 0913   K 4.3 02/26/2014 0913   CO2 26 02/26/2014 0913   GLUCOSE 89 02/26/2014 0913   BUN 11.4 02/26/2014 0913   CREATININE 0.8  02/26/2014 0913   CREATININE 0.74 12/22/2013 1214   CALCIUM 9.0 02/26/2014 0913   PROT 6.4 02/26/2014 0913   ALBUMIN 3.2* 02/26/2014 0913   AST 45* 02/26/2014 0913   ALT 46 02/26/2014 0913   ALKPHOS 195* 02/26/2014 0913   BILITOT 0.40 02/26/2014 0913   CA 19-9 on 12/24/2013-1861  Assessment/Plan:   1. Metastatic pancreas cancer, pancreas tail mass with invasion of the colon, status post a colonoscopy 12/17/2011 confirming metastatic adenocarcinoma involving biopsies of a splenic flexure and sigmoid colon lesions  CTs 12/24/2011 consistent with a pancreas tail mass with invasion of the splenic flexure of the colon and liver metastases 2. Microcytic anemia-likely iron deficiency secondary to bleeding from the colon masses  3.   pain secondary to #1  4.   celiac disease  5.   left foot drop following back for  6.   chronic urinary incontinence-status post placement of a InterStim implant  7.   glaucoma   Disposition:   Taylor Alvarez tolerated her first cycle of chemotherapy well. I have recommended that she proceed with cycle 2 of Gemzar and Abraxane today as scheduled. For her constipation, I have recommended that she use Colace 1 tablet twice a day on a routine basis. We have discussed the use of MiraLax, but the patient states she's used this in the past and has not been effective. Therefore, I have recommended she use magnesium citrate tablets 2 tablets daily. She will hold this if she develops any loose stools or diarrhea.  We will follow her pain, the elevated CA 19-9 and CT as markers of disease response.  She will return for an office visit and cycle 3 gemcitabine/Abraxane in 2 weeks.  Approximately30 minutes were spent with patient today. The majority of the time was used for counseling and coordination of care.  Taylor Alvarez 02/26/2014, 3:54 PM

## 2014-03-07 ENCOUNTER — Other Ambulatory Visit: Payer: Self-pay | Admitting: Oncology

## 2014-03-09 ENCOUNTER — Telehealth: Payer: Self-pay | Admitting: *Deleted

## 2014-03-09 NOTE — Telephone Encounter (Signed)
VERBAL ORDER AND READ BACK TO DR.SHERRILL- THIS IS NOT THE CHEMOTHERAPY. PT. MAY USE WARM NORMAL SALINE RINSES FOUR TIMES A DAY FOR HER SORE THROAT. INSTRUCTED PT. TO CALL IF SHE HAS A FEVER>100.5 OR MUCUS/SPUTUM BECOMES YELLOW/GREEN. PT. VOICES UNDERSTANDING.

## 2014-03-09 NOTE — Telephone Encounter (Signed)
PT. HAS A DRY COUGH AND IS HOARSE. HER NOSE IS RUNNING WITH CLEAR MUCUS. LEFT NOSTRIL IS SORE. SHE IS USING NORMAL SALINE NASAL SPRAY WHICH HELPS UNTIL PT. GOES OUTSIDE. SHE HAS ALLERGIES TO MANY DIFFERENT TYPES OF POLLEN. PT. TAKES CLARITIN DAILY. NO FEVER, SHORTNESS OF BREATH, OR WHEEZING. SHE IS SCHEDULED FOR CHEMOTHERAPY WITH GEMZAR AND ABRAXANE ON 03/12/14. PT. WANTED TO INFORM DR.SHERRIL TO SEE IF THERE IS ANYTHING TO DO. THIS NOTE TO DR.SHERRILL.

## 2014-03-10 ENCOUNTER — Telehealth: Payer: Self-pay | Admitting: *Deleted

## 2014-03-10 NOTE — Telephone Encounter (Signed)
Pt called states; "I'm seeing blood in my nose with the mucous"  Pt states it is painful and wanted to "check in" with Dr. Benay Spice.  Pt offered to be seen earlier--states "I don't think I need to be seen; just wanted him to know"  Per Dr. Benay Spice; notified pt to monitor for now; try using humidifier in home to help with nasal congestion/dry blood in nose.  Pt verbalized understanding of instructions and confirmed appt for 11/20 @ 10:45 and seeing K. Curico, NP to evaluate symptoms. Pt instructed to call if further problems; she verbalized understanding.

## 2014-03-12 ENCOUNTER — Ambulatory Visit (HOSPITAL_BASED_OUTPATIENT_CLINIC_OR_DEPARTMENT_OTHER): Payer: Medicare Other | Admitting: Oncology

## 2014-03-12 ENCOUNTER — Encounter: Payer: Self-pay | Admitting: Oncology

## 2014-03-12 ENCOUNTER — Ambulatory Visit: Payer: Medicare Other

## 2014-03-12 ENCOUNTER — Other Ambulatory Visit: Payer: Self-pay | Admitting: *Deleted

## 2014-03-12 ENCOUNTER — Other Ambulatory Visit (HOSPITAL_BASED_OUTPATIENT_CLINIC_OR_DEPARTMENT_OTHER): Payer: Medicare Other

## 2014-03-12 ENCOUNTER — Ambulatory Visit (HOSPITAL_COMMUNITY)
Admission: RE | Admit: 2014-03-12 | Discharge: 2014-03-12 | Disposition: A | Discharge status: Home / Self Care | Payer: Medicare Other | Source: Ambulatory Visit | Attending: Oncology | Admitting: Oncology

## 2014-03-12 ENCOUNTER — Ambulatory Visit (HOSPITAL_BASED_OUTPATIENT_CLINIC_OR_DEPARTMENT_OTHER): Payer: Medicare Other

## 2014-03-12 VITALS — BP 130/54 | HR 81 | Temp 97.9°F | Resp 18 | Ht 63.0 in | Wt 145.6 lb

## 2014-03-12 DIAGNOSIS — G893 Neoplasm related pain (acute) (chronic): Secondary | ICD-10-CM

## 2014-03-12 DIAGNOSIS — I82401 Acute embolism and thrombosis of unspecified deep veins of right lower extremity: Secondary | ICD-10-CM

## 2014-03-12 DIAGNOSIS — D509 Iron deficiency anemia, unspecified: Secondary | ICD-10-CM

## 2014-03-12 DIAGNOSIS — Z5111 Encounter for antineoplastic chemotherapy: Secondary | ICD-10-CM

## 2014-03-12 DIAGNOSIS — C252 Malignant neoplasm of tail of pancreas: Secondary | ICD-10-CM | POA: Diagnosis present

## 2014-03-12 DIAGNOSIS — I82492 Acute embolism and thrombosis of other specified deep vein of left lower extremity: Secondary | ICD-10-CM

## 2014-03-12 DIAGNOSIS — I82411 Acute embolism and thrombosis of right femoral vein: Secondary | ICD-10-CM | POA: Diagnosis not present

## 2014-03-12 DIAGNOSIS — I82491 Acute embolism and thrombosis of other specified deep vein of right lower extremity: Secondary | ICD-10-CM | POA: Diagnosis not present

## 2014-03-12 DIAGNOSIS — C785 Secondary malignant neoplasm of large intestine and rectum: Secondary | ICD-10-CM

## 2014-03-12 DIAGNOSIS — R600 Localized edema: Secondary | ICD-10-CM

## 2014-03-12 DIAGNOSIS — C787 Secondary malignant neoplasm of liver and intrahepatic bile duct: Secondary | ICD-10-CM

## 2014-03-12 DIAGNOSIS — I82409 Acute embolism and thrombosis of unspecified deep veins of unspecified lower extremity: Secondary | ICD-10-CM

## 2014-03-12 DIAGNOSIS — K5909 Other constipation: Secondary | ICD-10-CM

## 2014-03-12 DIAGNOSIS — I82431 Acute embolism and thrombosis of right popliteal vein: Secondary | ICD-10-CM | POA: Diagnosis not present

## 2014-03-12 HISTORY — DX: Acute embolism and thrombosis of unspecified deep veins of unspecified lower extremity: I82.409

## 2014-03-12 LAB — CBC WITH DIFFERENTIAL/PLATELET
BASO%: 0.7 % (ref 0.0–2.0)
Basophils Absolute: 0.1 10*3/uL (ref 0.0–0.1)
EOS ABS: 0.1 10*3/uL (ref 0.0–0.5)
EOS%: 1.2 % (ref 0.0–7.0)
HCT: 32.5 % — ABNORMAL LOW (ref 34.8–46.6)
HGB: 10.2 g/dL — ABNORMAL LOW (ref 11.6–15.9)
LYMPH%: 23.7 % (ref 14.0–49.7)
MCH: 25.8 pg (ref 25.1–34.0)
MCHC: 31.4 g/dL — ABNORMAL LOW (ref 31.5–36.0)
MCV: 82.3 fL (ref 79.5–101.0)
MONO#: 0.9 10*3/uL (ref 0.1–0.9)
MONO%: 10.2 % (ref 0.0–14.0)
NEUT%: 64.2 % (ref 38.4–76.8)
NEUTROS ABS: 5.5 10*3/uL (ref 1.5–6.5)
PLATELETS: 232 10*3/uL (ref 145–400)
RBC: 3.95 10*6/uL (ref 3.70–5.45)
RDW: 16.4 % — ABNORMAL HIGH (ref 11.2–14.5)
WBC: 8.6 10*3/uL (ref 3.9–10.3)
lymph#: 2 10*3/uL (ref 0.9–3.3)

## 2014-03-12 LAB — COMPREHENSIVE METABOLIC PANEL (CC13)
ALBUMIN: 3.1 g/dL — AB (ref 3.5–5.0)
ALK PHOS: 259 U/L — AB (ref 40–150)
ALT: 41 U/L (ref 0–55)
ANION GAP: 10 meq/L (ref 3–11)
AST: 43 U/L — ABNORMAL HIGH (ref 5–34)
BUN: 10.6 mg/dL (ref 7.0–26.0)
CO2: 25 meq/L (ref 22–29)
Calcium: 8.9 mg/dL (ref 8.4–10.4)
Chloride: 106 mEq/L (ref 98–109)
Creatinine: 0.8 mg/dL (ref 0.6–1.1)
Glucose: 115 mg/dl (ref 70–140)
Potassium: 4.2 mEq/L (ref 3.5–5.1)
Sodium: 140 mEq/L (ref 136–145)
Total Bilirubin: 0.36 mg/dL (ref 0.20–1.20)
Total Protein: 6.4 g/dL (ref 6.4–8.3)

## 2014-03-12 MED ORDER — ONDANSETRON 8 MG/50ML IVPB (CHCC)
8.0000 mg | Freq: Once | INTRAVENOUS | Status: AC
  Administered 2014-03-12: 8 mg via INTRAVENOUS

## 2014-03-12 MED ORDER — DEXAMETHASONE SODIUM PHOSPHATE 10 MG/ML IJ SOLN
INTRAMUSCULAR | Status: AC
  Filled 2014-03-12: qty 1

## 2014-03-12 MED ORDER — SODIUM CHLORIDE 0.9 % IV SOLN
800.0000 mg/m2 | Freq: Once | INTRAVENOUS | Status: AC
  Administered 2014-03-12: 1406 mg via INTRAVENOUS
  Filled 2014-03-12: qty 36.98

## 2014-03-12 MED ORDER — SODIUM CHLORIDE 0.9 % IV SOLN
Freq: Once | INTRAVENOUS | Status: AC
  Administered 2014-03-12: 14:00:00 via INTRAVENOUS

## 2014-03-12 MED ORDER — PACLITAXEL PROTEIN-BOUND CHEMO INJECTION 100 MG
100.0000 mg/m2 | Freq: Once | INTRAVENOUS | Status: AC
  Administered 2014-03-12: 175 mg via INTRAVENOUS
  Filled 2014-03-12: qty 35

## 2014-03-12 MED ORDER — DEXAMETHASONE SODIUM PHOSPHATE 10 MG/ML IJ SOLN
10.0000 mg | Freq: Once | INTRAMUSCULAR | Status: AC
  Administered 2014-03-12: 10 mg via INTRAVENOUS

## 2014-03-12 MED ORDER — HEPARIN SOD (PORK) LOCK FLUSH 100 UNIT/ML IV SOLN
500.0000 [IU] | Freq: Once | INTRAVENOUS | Status: AC | PRN
  Administered 2014-03-12: 500 [IU]
  Filled 2014-03-12: qty 5

## 2014-03-12 MED ORDER — ENOXAPARIN SODIUM 100 MG/ML ~~LOC~~ SOLN
100.0000 mg | Freq: Once | SUBCUTANEOUS | Status: AC
  Administered 2014-03-12: 100 mg via SUBCUTANEOUS
  Filled 2014-03-12: qty 1

## 2014-03-12 MED ORDER — SODIUM CHLORIDE 0.9 % IJ SOLN
10.0000 mL | INTRAMUSCULAR | Status: DC | PRN
  Administered 2014-03-12: 10 mL
  Filled 2014-03-12: qty 10

## 2014-03-12 MED ORDER — ONDANSETRON 8 MG/NS 50 ML IVPB
INTRAVENOUS | Status: AC
  Filled 2014-03-12: qty 8

## 2014-03-12 MED ORDER — ENOXAPARIN SODIUM 100 MG/ML ~~LOC~~ SOLN: 100.0000 mg | SUBCUTANEOUS | Status: DC

## 2014-03-12 NOTE — Progress Notes (Signed)
[facility] Follow-up visit   Referring MD: Carisha Kantor 80 y.o.  [birth year]    Diagnosis: Metastatic pancreatic cancer  HPI:  Ms. Flaim returns for routine followup with her niece. She has been tolerating her chemotherapy well with the exception of increased fatigue. She continues to be able to leave the house and enjoy the activities that she wishes to participate in, however. She reports increased sinus pressure and has a small amount of blood noted when blowing her nose. Uses Claritin and saline nasal spray. She has asked if she can use Flonase which she already has at home. Denies nausea and vomiting. Her appetite has remained good. Her weight is stable.she denies fevers, chills, chest pain, shortness of breath, abdominal pain, diarrhea. Denies bleeding other than the small amount of blood noted when she blows her nose. Mentions that she twisted her right ankle within the past 1-2 weeks and has had intermittent swelling to this area.    Past Medical History  Diagnosis Date  . Celiac disease   . GERD (gastroesophageal reflux disease)   . Hypertension   . Anxiety   .  metastatic pancreas cancer   September 2015         .  G4 P3   .  Glaucoma   .  Herpes zoster at the left leg    . Chronic left foot drop and left leg weakness following back surgery   . Chronic urinary incontinence  Past Surgical History  Procedure Laterality Date  .     .     . Back surgery    . Partial hysterectomy with bladder tac    . Bilateral cataract surgery    . Ovarian cyst removal    . Tubal ligation    . Cesarean section    . Colonoscopy  06/06/2011    Procedure: COLONOSCOPY;  Surgeon: Rogene Houston, MD;  Location: [facility];  Service: Endoscopy;  Laterality: N/A;  . Interstim implant placement  04/30/13,05/12/13  .  Left   . Colonoscopy N/A 12/16/2013    Procedure: COLONOSCOPY;  Surgeon: Rogene Houston, MD;  Location: [facility];  Service: Endoscopy;   Laterality: N/A;  730-rescheduled 8/26 @ 100 Ann notified pt    Medications: Reviewed  Allergies:  Allergies  Allergen Reactions  . Cephalexin Diarrhea and Nausea And Vomiting  . Clindamycin/Lincomycin Nausea Only  . Nsaids Other (See Comments)    Bleeding of the colon.  [place] [Diltiazem Hcl] Rash    On face    Family history: 2 maternal first cousins had melanoma, a paternal aunt had ovarian cancer. Her paternal grandfather had prostate cancer. She has 2 sisters. One sister had breast cancer.  Social History:   She lives in [place]. 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 She drinks wine occasionally. No transfusion history. No risk factor for HIV or hepatitis.   History  Smoking status  . Former Smoker -- 0.50 packs/day for 5 years  . Types: Cigarettes  . Quit date: 06/05/1953  Smokeless tobacco  . Never Used      ROS:   10 point review of systems is negative except as noted in history of present illness.  Physical Exam:  Blood pressure 130/54, pulse 81, temperature 97.9 F (36.6 C), temperature source Oral, resp. rate 18, height 5\' 3"  (1.6 m), weight 145 lb 9.6 oz (66.044 kg).  HEENT: Oropharynx without visible mass, neck without mass. TMs  clear without redness or exudate. Nares injected. Lungs: Clear bilaterally Cardiac: Regular rate and rhythm Abdomen: No hepatosplenomegaly, no mass. Mild tenderness in the left upper abdomen  Vascular: 1+ pitting edema noted to the right lower extremity from the ankle up to her knee. No palpable cords ar streaking noted. Left lower extremity without edema.  Lymph nodes: No cervical, supraclavicular, axillary, or inguinal nodes Neurologic: Alert and oriented, the motor exam appears intact in the upper and lower extremities Skin: No rash, lipoma at the left upper back Musculoskeletal: No spine tenderness   LAB:  CBC  Lab Results  Component Value Date   WBC 8.6 03/12/2014   HGB 10.2* 03/12/2014   HCT 32.5*  03/12/2014   MCV 82.3 03/12/2014   PLT 232 03/12/2014   NEUTROABS 5.5 03/12/2014     CMP      Component Value Date/Time   NA 140 03/12/2014 1039   K 4.2 03/12/2014 1039   CO2 25 03/12/2014 1039   GLUCOSE 115 03/12/2014 1039   BUN 10.6 03/12/2014 1039   CREATININE 0.8 03/12/2014 1039   CREATININE 0.74 12/22/2013 1214   CALCIUM 8.9 03/12/2014 1039   PROT 6.4 03/12/2014 1039   ALBUMIN 3.1* 03/12/2014 1039   AST 43* 03/12/2014 1039   ALT 41 03/12/2014 1039   ALKPHOS 259* 03/12/2014 1039   BILITOT 0.36 03/12/2014 1039   CA 19-9 on 12/24/2013-1861  Patient sent for a STAT Doppler U/S of RLE due to RLE edema and suspicion of DVT.  Summary:  - Findings consistent with acute deep vein thrombosis involving the posterior tibial vein coursing throgh the popliteal, femoral and common femoral veins of the right lower extremity - Positive for a superficial thrombus of the proximal greater saphenous vein extending into the saphenofemoral junction. - No deep vein thrombosis noted involving the left lower extremity. - Positive for a superficial thrombus in diffuse areas of the left greater saphenous vein. - No evidence of Baker&'s cyst on the right or left.  Assessment/Plan:   1. Metastatic pancreas cancer, pancreas tail mass with invasion of the colon, status post a colonoscopy 12/17/2011 confirming metastatic adenocarcinoma involving biopsies of a splenic flexure and sigmoid colon lesions  CTs 12/24/2011 consistent with a pancreas tail mass with invasion of the splenic flexure of the colon and liver metastases 2. Microcytic anemia-likely iron deficiency secondary to bleeding from the colon masses  3.   pain secondary to #1  4.   celiac disease  5.   left foot drop following back for  6.   chronic urinary incontinence-status post placement of a InterStim implant  7.   Glaucoma  8.   Acute DVT of the RLE and Left saphenous superficial thrombus diagnosed  03/12/14    Disposition:   Prior to chemo, the patient had a STAT Doppler U/S as noted above. Results reviewed with the patient. She was started on Lovenox 100 mg (1.5 mg/kg) daily today in our office. Risks, benefits, and side effects reviewed including risk for easy bruisability and bleeding. Patient agreeable to taking this medication. Patient and niece taught proper technique for injection and disposal of this medication. Rx for Lovenox sent to [facility].   Ms.Licausi continues to tolerate her chemotherapy well. I have recommended that she proceed with cycle 3 of Gemzar and Abraxane today as scheduled.   We have discussed that she may use her Flonase for allergy control. I have also suggested trying Zyrtec at night instead of Claritin as this may  be more effective in controlling her symptoms. I have also recommended use of a cool mist humidifier in her home.  We will follow her pain, the elevated CA 19-9 and CT as markers of disease response.  She will return for an office visit and cycle 4 gemcitabine/Abraxane in 2 weeks.  Approximately 60 minutes were spent with patient today. The majority of the time was used for counseling and coordination of care.  Case reviewed with Dr. Benay Spice.  Mikey Bussing 03/12/2014, 8:19 PM

## 2014-03-12 NOTE — Patient Instructions (Addendum)
[facility] Discharge Instructions for Patients Receiving Chemotherapy  Today you received the following chemotherapy agents Abraxane and Gemzar.  To help prevent nausea and vomiting after your treatment, we encourage you to take your nausea medication.   If you develop nausea and vomiting that is not controlled by your nausea medication, call the clinic.   BELOW ARE SYMPTOMS THAT SHOULD BE REPORTED IMMEDIATELY:  *FEVER GREATER THAN 100.5 F  *CHILLS WITH OR WITHOUT FEVER  NAUSEA AND VOMITING THAT IS NOT CONTROLLED WITH YOUR NAUSEA MEDICATION  *UNUSUAL SHORTNESS OF BREATH  *UNUSUAL BRUISING OR BLEEDING  TENDERNESS IN MOUTH AND THROAT WITH OR WITHOUT PRESENCE OF ULCERS  *URINARY PROBLEMS  *BOWEL PROBLEMS  UNUSUAL RASH Items with * indicate a potential emergency and should be followed up as soon as possible.  Feel free to call the clinic you have any questions or concerns. The clinic phone number is [phone].   Enoxaparin injection What is this medicine? ENOXAPARIN (ee nox a PA rin) is used after knee, hip, or abdominal surgeries to prevent blood clotting. It is also used to treat existing blood clots in the lungs or in the veins. This medicine may be used for other purposes; ask your health care provider or pharmacist if you have questions. COMMON BRAND NAME(S): Lovenox What should I tell my health care provider before I take this medicine? They need to know if you have any of these conditions: -bleeding disorders, hemorrhage, or hemophilia -infection of the heart or heart valves -kidney or liver disease -previous stroke -prosthetic heart valve -recent surgery or delivery of a baby -ulcer in the stomach or intestine, diverticulitis, or other bowel disease -an unusual or allergic reaction to enoxaparin, heparin, pork or pork products, other medicines, foods, dyes, or preservatives -pregnant or trying to get pregnant -breast-feeding How should I use  this medicine? This medicine is for injection under the skin. It is usually given by a health-care professional. You or a family member may be trained on how to give the injections. If you are to give yourself injections, make sure you understand how to use the syringe, measure the dose if necessary, and give the injection. To avoid bruising, do not rub the site where this medicine has been injected. Do not take your medicine more often than directed. Do not stop taking except on the advice of your doctor or health care professional. Make sure you receive a puncture-resistant container to dispose of the needles and syringes once you have finished with them. Do not reuse these items. Return the container to your doctor or health care professional for proper disposal. Talk to your pediatrician regarding the use of this medicine in children. Special care may be needed. Overdosage: If you think you have taken too much of this medicine contact a poison control center or emergency room at once. NOTE: This medicine is only for you. Do not share this medicine with others. What if I miss a dose? If you miss a dose, take it as soon as you can. If it is almost time for your next dose, take only that dose. Do not take double or extra doses. What may interact with this medicine? -aspirin and aspirin-like medicines -certain medicines that treat or prevent blood clots -dipyridamole -NSAIDs, medicines for pain and inflammation, like ibuprofen or naproxen This list may not describe all possible interactions. Give your health care provider a list of all the medicines, herbs, non-prescription drugs, or dietary supplements you use. Also tell them  if you smoke, drink alcohol, or use illegal drugs. Some items may interact with your medicine. What should I watch for while using this medicine? Visit your doctor or health care professional for regular checks on your progress. Your condition will be monitored carefully while  you are receiving this medicine. Notify your doctor or health care professional and seek emergency treatment if you develop breathing problems; changes in vision; chest pain; severe, sudden headache; pain, swelling, warmth in the leg; trouble speaking; sudden numbness or weakness of the face, arm, or leg. These can be signs that your condition has gotten worse. If you are going to have surgery, tell your doctor or health care professional that you are taking this medicine. Do not stop taking this medicine without first talking to your doctor. Be sure to refill your prescription before you run out of medicine. Avoid sports and activities that might cause injury while you are using this medicine. Severe falls or injuries can cause unseen bleeding. Be careful when using sharp tools or knives. Consider using an Copy. Take special care brushing or flossing your teeth. Report any injuries, bruising, or red spots on the skin to your doctor or health care professional. What side effects may I notice from receiving this medicine? Side effects that you should report to your doctor or health care professional as soon as possible: -allergic reactions like skin rash, itching or hives, swelling of the face, lips, or tongue -feeling faint or lightheaded, falls -signs and symptoms of bleeding such as bloody or black, tarry stools; red or dark-brown urine; spitting up blood or brown material that looks like coffee grounds; red spots on the skin; unusual bruising or bleeding from the eye, gums, or nose Side effects that usually do not require medical attention (report to your doctor or health care professional if they continue or are bothersome): -pain, redness, or irritation at site where injected This list may not describe all possible side effects. Call your doctor for medical advice about side effects. You may report side effects to FDA at 1-800-FDA-1088. Where should I keep my medicine? Keep out of the  reach of children. Store at room temperature between 15 and 30 degrees C (59 and 86 degrees F). Do not freeze. If your injections have been specially prepared, you may need to store them in the refrigerator. Ask your pharmacist. Throw away any unused medicine after the expiration date. NOTE: This sheet is a summary. It may not cover all possible information. If you have questions about this medicine, talk to your doctor, pharmacist, or health care provider.  2015, Elsevier/Gold Standard. (2013-08-11 16:06:21)

## 2014-03-12 NOTE — Progress Notes (Addendum)
VASCULAR LAB PRELIMINARY  PRELIMINARY  PRELIMINARY  PRELIMINARY  Bilateral lower extremity venous duplex completed.    Preliminary report:  Bilateral exam performed per protocol with positive results in requested extremity. Positive for a right lower extremity DVT coursing from the distal posterior tibial vein through the popliteal, femoral and common femoral veins. There is no evidence of a superficial thrombosis or Baker's cyst. Left - no evidence of a DVT. Positive for diffuse superficial thrombus of the greater saphenous vein distally to the saphenous femoral junction. No evidence of a Baker's cyst.  Kinney Sackmann, RVS 03/12/2014, 12:57 PM

## 2014-03-13 LAB — CANCER ANTIGEN 19-9: CA 19-9: 38553.7 U/mL — ABNORMAL HIGH

## 2014-03-15 ENCOUNTER — Telehealth: Payer: Self-pay | Admitting: *Deleted

## 2014-03-15 ENCOUNTER — Other Ambulatory Visit: Payer: Self-pay | Admitting: Oncology

## 2014-03-15 NOTE — Telephone Encounter (Signed)
Received message from pt "I had some greenish mucous in my nose this morning and just wanted to check in with you"  Spoke with pt and she denies fever/N/V; eating and drinking "as much as I can"  Feels "Okay; just little tired"  Pt instructed to monitor for now and call if any fever 100.5 or above.  Pt verbalized understanding and Dr. Benay Spice made aware.

## 2014-03-26 ENCOUNTER — Ambulatory Visit (HOSPITAL_BASED_OUTPATIENT_CLINIC_OR_DEPARTMENT_OTHER): Payer: Medicare Other | Admitting: Oncology

## 2014-03-26 ENCOUNTER — Other Ambulatory Visit: Payer: Self-pay | Admitting: Oncology

## 2014-03-26 ENCOUNTER — Telehealth: Payer: Self-pay | Admitting: Oncology

## 2014-03-26 ENCOUNTER — Other Ambulatory Visit (HOSPITAL_BASED_OUTPATIENT_CLINIC_OR_DEPARTMENT_OTHER): Payer: Medicare Other

## 2014-03-26 ENCOUNTER — Encounter: Payer: Self-pay | Admitting: Oncology

## 2014-03-26 ENCOUNTER — Ambulatory Visit: Payer: Medicare Other

## 2014-03-26 ENCOUNTER — Ambulatory Visit (HOSPITAL_BASED_OUTPATIENT_CLINIC_OR_DEPARTMENT_OTHER): Payer: Medicare Other

## 2014-03-26 VITALS — BP 145/65 | HR 80 | Temp 98.0°F | Resp 18 | Wt 143.9 lb

## 2014-03-26 DIAGNOSIS — I82401 Acute embolism and thrombosis of unspecified deep veins of right lower extremity: Secondary | ICD-10-CM

## 2014-03-26 DIAGNOSIS — Z5111 Encounter for antineoplastic chemotherapy: Secondary | ICD-10-CM

## 2014-03-26 DIAGNOSIS — C7989 Secondary malignant neoplasm of other specified sites: Secondary | ICD-10-CM

## 2014-03-26 DIAGNOSIS — C252 Malignant neoplasm of tail of pancreas: Secondary | ICD-10-CM

## 2014-03-26 DIAGNOSIS — D500 Iron deficiency anemia secondary to blood loss (chronic): Secondary | ICD-10-CM

## 2014-03-26 LAB — CBC WITH DIFFERENTIAL/PLATELET
BASO%: 1.4 % (ref 0.0–2.0)
Basophils Absolute: 0.1 10*3/uL (ref 0.0–0.1)
EOS%: 3.9 % (ref 0.0–7.0)
Eosinophils Absolute: 0.3 10*3/uL (ref 0.0–0.5)
HCT: 32.6 % — ABNORMAL LOW (ref 34.8–46.6)
HGB: 10.3 g/dL — ABNORMAL LOW (ref 11.6–15.9)
LYMPH%: 31.2 % (ref 14.0–49.7)
MCH: 26.1 pg (ref 25.1–34.0)
MCHC: 31.6 g/dL (ref 31.5–36.0)
MCV: 82.7 fL (ref 79.5–101.0)
MONO#: 1.4 10*3/uL — ABNORMAL HIGH (ref 0.1–0.9)
MONO%: 17.6 % — AB (ref 0.0–14.0)
NEUT#: 3.7 10*3/uL (ref 1.5–6.5)
NEUT%: 45.9 % (ref 38.4–76.8)
PLATELETS: 319 10*3/uL (ref 145–400)
RBC: 3.94 10*6/uL (ref 3.70–5.45)
RDW: 18.6 % — ABNORMAL HIGH (ref 11.2–14.5)
WBC: 8.1 10*3/uL (ref 3.9–10.3)
lymph#: 2.5 10*3/uL (ref 0.9–3.3)
nRBC: 0 % (ref 0–0)

## 2014-03-26 LAB — COMPREHENSIVE METABOLIC PANEL (CC13)
ALT: 81 U/L — AB (ref 0–55)
AST: 55 U/L — AB (ref 5–34)
Albumin: 3 g/dL — ABNORMAL LOW (ref 3.5–5.0)
Alkaline Phosphatase: 284 U/L — ABNORMAL HIGH (ref 40–150)
Anion Gap: 10 mEq/L (ref 3–11)
BILIRUBIN TOTAL: 0.29 mg/dL (ref 0.20–1.20)
BUN: 13.8 mg/dL (ref 7.0–26.0)
CO2: 26 mEq/L (ref 22–29)
CREATININE: 0.8 mg/dL (ref 0.6–1.1)
Calcium: 9.1 mg/dL (ref 8.4–10.4)
Chloride: 106 mEq/L (ref 98–109)
EGFR: 74 mL/min/{1.73_m2} — ABNORMAL LOW (ref >90)
Glucose: 92 mg/dl (ref 70–140)
Potassium: 4.5 mEq/L (ref 3.5–5.1)
Sodium: 142 mEq/L (ref 136–145)
Total Protein: 6.1 g/dL — ABNORMAL LOW (ref 6.4–8.3)

## 2014-03-26 LAB — CANCER ANTIGEN 19-9: CA 19 9: 53717.1 U/mL — AB (ref <35.0)

## 2014-03-26 MED ORDER — ONDANSETRON 8 MG/50ML IVPB (CHCC)
8.0000 mg | Freq: Once | INTRAVENOUS | Status: AC
  Administered 2014-03-26: 8 mg via INTRAVENOUS

## 2014-03-26 MED ORDER — HEPARIN SOD (PORK) LOCK FLUSH 100 UNIT/ML IV SOLN
500.0000 [IU] | Freq: Once | INTRAVENOUS | Status: AC | PRN
  Administered 2014-03-26: 500 [IU]
  Filled 2014-03-26: qty 5

## 2014-03-26 MED ORDER — SODIUM CHLORIDE 0.9 % IJ SOLN
10.0000 mL | INTRAMUSCULAR | Status: DC | PRN
  Administered 2014-03-26: 10 mL
  Filled 2014-03-26: qty 10

## 2014-03-26 MED ORDER — DEXAMETHASONE SODIUM PHOSPHATE 10 MG/ML IJ SOLN
INTRAMUSCULAR | Status: AC
  Filled 2014-03-26: qty 1

## 2014-03-26 MED ORDER — ONDANSETRON 8 MG/NS 50 ML IVPB
INTRAVENOUS | Status: AC
  Filled 2014-03-26: qty 8

## 2014-03-26 MED ORDER — DEXAMETHASONE SODIUM PHOSPHATE 10 MG/ML IJ SOLN
10.0000 mg | Freq: Once | INTRAMUSCULAR | Status: AC
  Administered 2014-03-26: 10 mg via INTRAVENOUS

## 2014-03-26 MED ORDER — GEMCITABINE HCL CHEMO INJECTION 1 GM/26.3ML
800.0000 mg/m2 | Freq: Once | INTRAVENOUS | Status: AC
  Administered 2014-03-26: 1406 mg via INTRAVENOUS
  Filled 2014-03-26: qty 36.98

## 2014-03-26 MED ORDER — SODIUM CHLORIDE 0.9 % IV SOLN
Freq: Once | INTRAVENOUS | Status: AC
  Administered 2014-03-26: 11:00:00 via INTRAVENOUS

## 2014-03-26 MED ORDER — PACLITAXEL PROTEIN-BOUND CHEMO INJECTION 100 MG
100.0000 mg/m2 | Freq: Once | INTRAVENOUS | Status: AC
  Administered 2014-03-26: 175 mg via INTRAVENOUS
  Filled 2014-03-26: qty 35

## 2014-03-26 NOTE — Progress Notes (Signed)
[facility] Follow-up visit   Referring MD: Jonet Mathies 80 y.o.  1931-09-01    Diagnosis: Metastatic pancreatic cancer  HPI:  Taylor Alvarez returns for routine followup with her niece and friend. She has been tolerating her chemotherapy well with the exception of increased fatigue. She continues to be able to leave the house and enjoy the activities that she wishes to participate in. She has been on Lovenox for the past 2 weeks due to a new diagnosis of DVT in the right leg. She is having the Lovenox injected at her local pharmacy. Tolerating this well. Denies bleeding. RLE edema is improving. Denies nausea and vomiting. Her appetite has remained good. Her weight is stable.she denies fevers, chills, chest pain, shortness of breath, abdominal pain, diarrhea.     Past Medical History  Diagnosis Date  . Celiac disease   . GERD (gastroesophageal reflux disease)   . Hypertension   . Anxiety   .  metastatic pancreas cancer   September 2015         .  G4 P3   .  Glaucoma   .  Herpes zoster at the left leg    . Chronic left foot drop and left leg weakness following back surgery   . Chronic urinary incontinence  Past Surgical History  Procedure Laterality Date  .     .     . Back surgery    . Partial hysterectomy with bladder tac    . Bilateral cataract surgery    . Ovarian cyst removal    . Tubal ligation    . Cesarean section    . Colonoscopy  06/06/2011    Procedure: COLONOSCOPY;  Surgeon: Rogene Houston, MD;  Location: [facility];  Service: Endoscopy;  Laterality: N/A;  . Interstim implant placement  04/30/13,05/12/13  .  Left   . Colonoscopy N/A 12/16/2013    Procedure: COLONOSCOPY;  Surgeon: Rogene Houston, MD;  [facility]: [facility];  Service: Endoscopy;  Laterality: N/A;  730-rescheduled 8/26 @ 100 Ann notified pt    Medications: Reviewed  Allergies:  Allergies  Allergen Reactions  . Cephalexin Diarrhea and Nausea And Vomiting    . Clindamycin/Lincomycin Nausea Only  . Nsaids Other (See Comments)    Bleeding of the colon.  [place] [Diltiazem Hcl] Rash    On face    Family history: 2 maternal first cousins had melanoma, a paternal aunt had ovarian cancer. Her paternal grandfather had prostate cancer. She has 2 sisters. One sister had breast cancer.  Social History:   She lives in [place]. 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 She drinks wine occasionally. No transfusion history. No risk factor for HIV or hepatitis.   History  Smoking status  . Former Smoker -- 0.50 packs/day for 5 years  . Types: Cigarettes  . Quit date: 06/05/1953  Smokeless tobacco  . Never Used      ROS:   10 point review of systems is negative except as noted in history of present illness.  Physical Exam:  Blood pressure 145/65, pulse 80, temperature 98 F (36.7 C), resp. rate 18, weight 143 lb 14.4 oz (65.273 kg).  HEENT: Oropharynx without visible mass, neck without mass. TMs clear without redness or exudate. Nares injected. Lungs: Clear bilaterally Cardiac: Regular rate and rhythm Abdomen: No hepatosplenomegaly, no mass. Mild tenderness in the left upper abdomen  Vascular: Trace edema to the RLE. No palpable cords  ar streaking noted. Left lower extremity without edema.  Lymph nodes: No cervical, supraclavicular, axillary, or inguinal nodes Neurologic: Alert and oriented, the motor exam appears intact in the upper and lower extremities Skin: No rash, lipoma at the left upper back Musculoskeletal: No spine tenderness   LAB:  CBC  Lab Results  Component Value Date   WBC 8.6 03/12/2014   HGB 10.2* 03/12/2014   HCT 32.5* 03/12/2014   MCV 82.3 03/12/2014   PLT 232 03/12/2014   NEUTROABS 5.5 03/12/2014     CMP      Component Value Date/Time   NA 140 03/12/2014 1039   K 4.2 03/12/2014 1039   CO2 25 03/12/2014 1039   GLUCOSE 115 03/12/2014 1039   BUN 10.6 03/12/2014 1039   CREATININE 0.8  03/12/2014 1039   CREATININE 0.74 12/22/2013 1214   CALCIUM 8.9 03/12/2014 1039   PROT 6.4 03/12/2014 1039   ALBUMIN 3.1* 03/12/2014 1039   AST 43* 03/12/2014 1039   ALT 41 03/12/2014 1039   ALKPHOS 259* 03/12/2014 1039   BILITOT 0.36 03/12/2014 1039   CA 19-9 on 12/24/2013-1861                    02/04/2014-6821                    03/12/2014-38553   Assessment/Plan:   1. Metastatic pancreas cancer, pancreas tail mass with invasion of the colon, status post a colonoscopy 12/17/2011 confirming metastatic adenocarcinoma involving biopsies of a splenic flexure and sigmoid colon lesions  CTs 12/24/2011 consistent with a pancreas tail mass with invasion of the splenic flexure of the colon and liver metastases 2. Microcytic anemia-likely iron deficiency secondary to bleeding from the colon masses  3.   pain secondary to #1  4.   celiac disease  5.   left foot drop following back surgery  6.   chronic urinary incontinence-status post placement of a InterStim implant  7.   Glaucoma  8.   Acute DVT of the RLE and Left saphenous superficial thrombus diagnosed 03/12/14    Disposition:   Patient seen and examined with Dr. Benay Spice. Patient is tolerating her chemotherapy well. Ca 19.9 is rising though. Ca 19.9 is pending today. Discussed with patient and family that the plan is to proceed with cycle 4 of her chemo today. If ca 19.9 continues to rise, then will need to proceed with repeat imaging.   For her DVT she will continue Lovenox 100 mg sq daily.  She will return for an office visit and cycle 5 gemcitabine/Abraxane in 2 weeks.   Taylor Alvarez 03/26/2014, 11:58 AM   This was a shared visit with Altamese Dilling. She will continue Lovenox for treatment of the DVT. We will check the CA 19-9 today. We discussed the elevated CA 19-9 from 03/12/2014 with Taylor Alvarez and her family.  Julieanne Manson, M.D.

## 2014-03-26 NOTE — Patient Instructions (Signed)
[facility] Discharge Instructions for Patients Receiving Chemotherapy  Today you received the following chemotherapy agents: Abraxane and Gemzar  To help prevent nausea and vomiting after your treatment, we encourage you to take your nausea medication: Compazine 10 mg every 6 hours as needed.   If you develop nausea and vomiting that is not controlled by your nausea medication, call the clinic.   BELOW ARE SYMPTOMS THAT SHOULD BE REPORTED IMMEDIATELY:  *FEVER GREATER THAN 100.5 F  *CHILLS WITH OR WITHOUT FEVER  NAUSEA AND VOMITING THAT IS NOT CONTROLLED WITH YOUR NAUSEA MEDICATION  *UNUSUAL SHORTNESS OF BREATH  *UNUSUAL BRUISING OR BLEEDING  TENDERNESS IN MOUTH AND THROAT WITH OR WITHOUT PRESENCE OF ULCERS  *URINARY PROBLEMS  *BOWEL PROBLEMS  UNUSUAL RASH Items with * indicate a potential emergency and should be followed up as soon as possible.  Feel free to call the clinic you have any questions or concerns. The clinic phone number is [phone].    

## 2014-03-26 NOTE — Telephone Encounter (Signed)
gv and printed appt sched and avs for pt for Dec...sed added tx. °

## 2014-03-26 NOTE — Progress Notes (Signed)
Reviewed labs. Made Dr. Benay Spice aware of labs. OK to treat today.

## 2014-03-30 ENCOUNTER — Other Ambulatory Visit: Payer: Self-pay | Admitting: *Deleted

## 2014-03-30 DIAGNOSIS — I82401 Acute embolism and thrombosis of unspecified deep veins of right lower extremity: Secondary | ICD-10-CM

## 2014-03-30 MED ORDER — ENOXAPARIN SODIUM 100 MG/ML ~~LOC~~ SOLN: 100.0000 mg | SUBCUTANEOUS | Status: DC

## 2014-04-01 ENCOUNTER — Telehealth: Payer: Self-pay | Admitting: *Deleted

## 2014-04-01 NOTE — Telephone Encounter (Signed)
Left VM that she needs new script for the pharmacy in [place] for her "blood thinner shot". Called back and left VM that it was sent electronically on 03/30/14 to [facility] if not there or this is incorrect pharmacy.

## 2014-04-02 ENCOUNTER — Other Ambulatory Visit: Payer: Self-pay | Admitting: *Deleted

## 2014-04-02 ENCOUNTER — Telehealth: Payer: Self-pay | Admitting: Oncology

## 2014-04-02 ENCOUNTER — Telehealth: Payer: Self-pay | Admitting: *Deleted

## 2014-04-02 DIAGNOSIS — C252 Malignant neoplasm of tail of pancreas: Secondary | ICD-10-CM

## 2014-04-02 NOTE — Telephone Encounter (Signed)
Made patient aware of rise in CA 19.9 and MD wants CT abd/pelvis prior to 12/18. Will also post info in Mychart for her.

## 2014-04-02 NOTE — Telephone Encounter (Signed)
central scheduling will contact pt re ct appt - no other orders per 12/11 pof.

## 2014-04-02 NOTE — Telephone Encounter (Signed)
Left VM this morning saying "I lost my balance yesterday and my blood pressure went up. Just wanted Dr. Benay Spice to know". Then she disconnected. Called back and left VM that MD will be made aware. Call back if her unsteadiness does not improve or worsens. Push fluids, eat small frequent meals, and check her BP if she is able to at home.

## 2014-04-02 NOTE — Telephone Encounter (Signed)
-----   Message from Ladell Pier, MD sent at 04/01/2014  7:42 PM EST ----- Please call patient, ca19-9 is higher, scheduled CT abd/pelvis prior to next visit

## 2014-04-08 ENCOUNTER — Encounter (HOSPITAL_COMMUNITY): Payer: Self-pay

## 2014-04-08 ENCOUNTER — Ambulatory Visit (HOSPITAL_COMMUNITY)
Admission: RE | Admit: 2014-04-08 | Discharge: 2014-04-08 | Disposition: A | Discharge status: Home / Self Care | Payer: Medicare Other | Source: Ambulatory Visit | Attending: Oncology | Admitting: Oncology

## 2014-04-08 DIAGNOSIS — Z79899 Other long term (current) drug therapy: Secondary | ICD-10-CM | POA: Diagnosis not present

## 2014-04-08 DIAGNOSIS — R109 Unspecified abdominal pain: Secondary | ICD-10-CM | POA: Diagnosis present

## 2014-04-08 DIAGNOSIS — C252 Malignant neoplasm of tail of pancreas: Secondary | ICD-10-CM | POA: Diagnosis not present

## 2014-04-08 DIAGNOSIS — K5900 Constipation, unspecified: Secondary | ICD-10-CM | POA: Diagnosis not present

## 2014-04-08 HISTORY — DX: Malignant (primary) neoplasm, unspecified: C80.1

## 2014-04-08 MED ORDER — IOHEXOL 300 MG/ML  SOLN
100.0000 mL | Freq: Once | INTRAMUSCULAR | Status: AC | PRN
  Administered 2014-04-08: 100 mL via INTRAVENOUS

## 2014-04-08 MED ORDER — IOHEXOL 300 MG/ML  SOLN
50.0000 mL | Freq: Once | INTRAMUSCULAR | Status: AC | PRN
  Administered 2014-04-08: 50 mL via ORAL

## 2014-04-09 ENCOUNTER — Telehealth: Payer: Self-pay | Admitting: Nurse Practitioner

## 2014-04-09 ENCOUNTER — Other Ambulatory Visit (HOSPITAL_BASED_OUTPATIENT_CLINIC_OR_DEPARTMENT_OTHER): Payer: Medicare Other

## 2014-04-09 ENCOUNTER — Encounter (INDEPENDENT_AMBULATORY_CARE_PROVIDER_SITE_OTHER): Payer: Self-pay

## 2014-04-09 ENCOUNTER — Ambulatory Visit: Payer: Medicare Other

## 2014-04-09 ENCOUNTER — Ambulatory Visit (HOSPITAL_BASED_OUTPATIENT_CLINIC_OR_DEPARTMENT_OTHER): Payer: Medicare Other | Admitting: Nurse Practitioner

## 2014-04-09 VITALS — BP 114/54 | HR 93 | Temp 98.4°F | Resp 18 | Ht 63.0 in | Wt 141.5 lb

## 2014-04-09 DIAGNOSIS — Z86718 Personal history of other venous thrombosis and embolism: Secondary | ICD-10-CM

## 2014-04-09 DIAGNOSIS — C252 Malignant neoplasm of tail of pancreas: Secondary | ICD-10-CM

## 2014-04-09 DIAGNOSIS — I82401 Acute embolism and thrombosis of unspecified deep veins of right lower extremity: Secondary | ICD-10-CM

## 2014-04-09 DIAGNOSIS — R978 Other abnormal tumor markers: Secondary | ICD-10-CM

## 2014-04-09 LAB — CBC WITH DIFFERENTIAL/PLATELET
BASO%: 1.3 % (ref 0.0–2.0)
Basophils Absolute: 0.1 10*3/uL (ref 0.0–0.1)
EOS ABS: 0.2 10*3/uL (ref 0.0–0.5)
EOS%: 2.9 % (ref 0.0–7.0)
HEMATOCRIT: 35.3 % (ref 34.8–46.6)
HGB: 11 g/dL — ABNORMAL LOW (ref 11.6–15.9)
LYMPH#: 1.5 10*3/uL (ref 0.9–3.3)
LYMPH%: 20.2 % (ref 14.0–49.7)
MCH: 26.3 pg (ref 25.1–34.0)
MCHC: 31.1 g/dL — ABNORMAL LOW (ref 31.5–36.0)
MCV: 84.6 fL (ref 79.5–101.0)
MONO#: 0.9 10*3/uL (ref 0.1–0.9)
MONO%: 13 % (ref 0.0–14.0)
NEUT#: 4.5 10*3/uL (ref 1.5–6.5)
NEUT%: 62.6 % (ref 38.4–76.8)
PLATELETS: 344 10*3/uL (ref 145–400)
RBC: 4.18 10*6/uL (ref 3.70–5.45)
RDW: 20.7 % — ABNORMAL HIGH (ref 11.2–14.5)
WBC: 7.2 10*3/uL (ref 3.9–10.3)

## 2014-04-09 LAB — COMPREHENSIVE METABOLIC PANEL (CC13)
ALBUMIN: 3.1 g/dL — AB (ref 3.5–5.0)
ALT: 73 U/L — AB (ref 0–55)
AST: 66 U/L — ABNORMAL HIGH (ref 5–34)
Alkaline Phosphatase: 276 U/L — ABNORMAL HIGH (ref 40–150)
Anion Gap: 10 mEq/L (ref 3–11)
BILIRUBIN TOTAL: 0.38 mg/dL (ref 0.20–1.20)
BUN: 13.5 mg/dL (ref 7.0–26.0)
CO2: 26 meq/L (ref 22–29)
Calcium: 9.2 mg/dL (ref 8.4–10.4)
Chloride: 104 mEq/L (ref 98–109)
Creatinine: 0.8 mg/dL (ref 0.6–1.1)
EGFR: 72 mL/min/{1.73_m2} — AB (ref >90)
GLUCOSE: 68 mg/dL — AB (ref 70–140)
Potassium: 3.8 mEq/L (ref 3.5–5.1)
SODIUM: 140 meq/L (ref 136–145)
Total Protein: 6.5 g/dL (ref 6.4–8.3)

## 2014-04-09 LAB — CANCER ANTIGEN 19-9: CA 19-9: 77003.2 U/mL — ABNORMAL HIGH (ref <35.0)

## 2014-04-09 NOTE — Progress Notes (Addendum)
[place] OFFICE PROGRESS NOTE   Diagnosis:  Pancreas cancer  INTERVAL HISTORY:   Taylor Alvarez returns as scheduled. She completed cycle 4 gemcitabine/Abraxane 03/26/2014. She feels she is tolerating the chemotherapy well. She denies nausea/vomiting. No mouth sores. No diarrhea. She tends to be constipated. No fever following treatment. No numbness/tingling in her hands or feet. No skin rash. She notes improvement in the leg swelling. She continues Lovenox. She denies bleeding. She has a good appetite. She remains very active. She has mild intermittent pain at the left upper abdomen. She takes Vicodin occasionally. Last week she had an episode of feeling "unbalanced".  Objective:  Vital signs in last 24 hours:  Blood pressure 114/54, pulse 93, temperature 98.4 F (36.9 C), temperature source Oral, resp. rate 18, height 5\' 3"  (1.6 m), weight 141 lb 8 oz (64.184 kg), SpO2 98 %.    HEENT: No thrush or ulcers. Resp: Lungs clear bilaterally. Cardio: Regular rate and rhythm. GI: Abdomen soft and nontender. No hepatomegaly. No mass. Vascular: Trace edema right lower leg.  Port-A-Cath without erythema.    Lab Results:  Lab Results  Component Value Date   WBC 7.2 04/09/2014   HGB 11.0* 04/09/2014   HCT 35.3 04/09/2014   MCV 84.6 04/09/2014   PLT 344 04/09/2014   NEUTROABS 4.5 04/09/2014    Imaging:  Ct Abdomen Pelvis W Contrast  04/08/2014   CLINICAL DATA:  Left-sided abdominal pain with constipation. Pancreatic cancer diagnosed 3 months ago. Chemotherapy in progress. Subsequent ectatic.  EXAM: CT ABDOMEN AND PELVIS WITH CONTRAST  TECHNIQUE: Multidetector CT imaging of the abdomen and pelvis was performed using the standard protocol following bolus administration of intravenous contrast.  CONTRAST:  177mL OMNIPAQUE IOHEXOL 300 MG/ML  SOLN  COMPARISON:  Prior study 12/23/2013.  FINDINGS: Lower chest: Clear lung bases. No significant pleural or pericardial effusion.   Hepatobiliary: There is progressive multifocal hepatic metastatic disease. A previously referenced lesions centrally in the right hepatic lobe measures 2.6 x 2.2 cm on image 19 of series 2 (previously 1.4 x 1.2 cm). There are innumerable new lesions throughout all segments of the liver. No evidence of gallstones, gallbladder wall thickening or biliary dilatation.  Pancreas: The complex mass involving the pancreatic tail is difficult to accurately measure given its irregular shape, but is similar to the prior study. This measures approximately 4.8 x 3.9 cm on image 17 and demonstrates central low-density and calcification. Again, this mass likely invades the splenic hilum. The lesion abuts the splenic flexure of the colon and may invade it. The pancreatic body and tail appear normal.  Spleen: The pancreatic tail mass may invade the spleen. There is low-density anteriorly in the spleen.  Adrenals/Urinary Tract: There is stable minimal nonspecific nodularity of the left adrenal gland. The right adrenal gland appears normal.The kidneys appear normal without evidence of urinary tract calculus or hydronephrosis. No bladder abnormalities are seen.  Stomach/Bowel: The stomach is decompressed. No bowel wall thickening, distention or surrounding inflammatory change demonstrated. As noted above, the mass within the pancreatic tail may involve the splenic flexure of the colon. There is no extravasated enteric contrast. There is no ascites.However, there is new nodularity in the pelvis, including a 2.4 cm component on image 59 which demonstrates mildly thickened walls. There is no generalized peritoneal nodularity.  Vascular/Lymphatic: There are no enlarged abdominal or pelvic lymph nodes. As above, there is left pelvic nodularity suspicious for possible peritoneal carcinomatosis. There is stable mild atherosclerosis of the aorta and  iliac arteries. The portal and superior mesenteric veins are patent. The splenic vein is  probably occluded peripherally at the level of the pancreatic mass. There is nonocclusive DVT within the right common femoral vein (images 68 through 78). No pelvic DVT demonstrated.  Reproductive: Status post hysterectomy.  Other: No evidence of abdominal wall mass or hernia.  Musculoskeletal: No acute or significant osseous findings. There is no evidence of osseous metastatic disease. Postsurgical changes are present status post lumbar laminectomy and neurostimulator placement. The low-density left paraspinal lesion in the lower thoracic region measuring 2.4 cm on image number 3 is unchanged.  IMPRESSION: 1. Progressive multifocal hepatic metastatic disease. 2. New nodularity in the pelvis suspicious for peritoneal carcinomatosis. There is no ascites or generalized peritoneal nodularity. 3. The complex mass involving the pancreatic tail has not grossly changed. Again, this may invade the spleen and splenic flexure of the colon. The splenic vein is likely occluded. 4. Nonocclusive right femoral DVT. 5. These results will be called to the ordering clinician or representative by the Radiologist Assistant, and communication documented in the PACS or zVision Dashboard.   Electronically Signed   By: Camie Patience M.D.   On: 04/08/2014 16:09    Medications: I have reviewed the patient's current medications.  Assessment/Plan: 1. Metastatic pancreas cancer, pancreas tail mass with invasion of the colon, status post a colonoscopy 12/17/2011 confirming metastatic adenocarcinoma involving biopsies of a splenic flexure and sigmoid colon lesions  CTs 12/24/2011 consistent with a pancreas tail mass with invasion of the splenic flexure of the colon and liver metastases  Initiation of every 2 weeks of decitabine/Abraxane 02/12/2014  Marked progressive rise in the CA-19-9 03/12/2014, 03/26/2014  Restaging CT evaluation 04/08/2014 showed progressive multifocal hepatic metastatic disease, new nodularity in the pelvis  suspicious for peritoneal carcinomatosis, pancreatic tail mass unchanged. 2. Microcytic anemia-likely iron deficiency secondary to bleeding from the colon masses  3. Pain secondary to #1  4. Celiac disease  5. Left foot drop following back surgery  6. Chronic urinary incontinence-status post placement of a InterStim implant  7. Glaucoma  8. Acute DVT of the RLE and Left saphenous superficial thrombus diagnosed 03/12/14. Maintained on daily Lovenox.   Disposition: Ms. Mishkin appears stable. She has completed 4 cycles of gemcitabine/Abraxane. The CA-19-9 tumor marker has increased and the recent restaging CT evaluation shows evidence of progressive disease. Dr. Benay Spice reviewed the CT images on the computer with Ms. Mandile and her niece.  Gemcitabine/Abraxane will be discontinued. Dr. Benay Spice discussed observation/supportive care versus a trial of FOLFOX. We reviewed potential toxicities associated with 5-fluorouracil including mouth sores, nausea, diarrhea, hand-foot syndrome, skin hyperpigmentation, skin rash. We reviewed potential toxicities associated with oxaliplatin including myelosuppression, nausea and the various forms of neurotoxicity. She was provided with written information as well. She would like to consider the options and return for a follow-up visit after the holidays to let us know her decision.  She will return for a follow-up visit on 04/21/2014. She will contact the office in the interim with any problems.  Patient seen with Dr. Benay Spice. 25 minutes were spent face-to-face at today's visit with the majority of that time involving counseling/coordination of care.    Ned Card ANP/GNP-BC   04/09/2014  11:33 AM  This was a shared visit with Ned Card. The CA 19-9 is higher. The CT reveals disease progression. I reviewed the CT images with Ms. Mccaughey. I recommend discontinuing gemcitabine/Abraxane. We discussed supportive care versus second line  chemotherapy. We specifically discussed  the FOLFOX regimen. She is undecided on further chemotherapy. She will return for further discussion after the Christmas holiday.  Julieanne Manson, M.D.

## 2014-04-09 NOTE — Patient Instructions (Signed)
Fluorouracil, 5-FU injection What is this medicine? FLUOROURACIL, 5-FU (flure oh YOOR a sil) is a chemotherapy drug. It slows the growth of cancer cells. This medicine is used to treat many types of cancer like breast cancer, colon or rectal cancer, pancreatic cancer, and stomach cancer. This medicine may be used for other purposes; ask your health care provider or pharmacist if you have questions. COMMON BRAND NAME(S): Adrucil What should I tell my health care provider before I take this medicine? They need to know if you have any of these conditions: -blood disorders -dihydropyrimidine dehydrogenase (DPD) deficiency -infection (especially a virus infection such as chickenpox, cold sores, or herpes) -kidney disease -liver disease -malnourished, poor nutrition -recent or ongoing radiation therapy -an unusual or allergic reaction to fluorouracil, other chemotherapy, other medicines, foods, dyes, or preservatives -pregnant or trying to get pregnant -breast-feeding How should I use this medicine? This drug is given as an infusion or injection into a vein. It is administered in a hospital or clinic by a specially trained health care professional. Talk to your pediatrician regarding the use of this medicine in children. Special care may be needed. Overdosage: If you think you have taken too much of this medicine contact a poison control center or emergency room at once. NOTE: This medicine is only for you. Do not share this medicine with others. What if I miss a dose? It is important not to miss your dose. Call your doctor or health care professional if you are unable to keep an appointment. What may interact with this medicine? -allopurinol -cimetidine -dapsone -digoxin -hydroxyurea -leucovorin -levamisole -medicines for seizures like ethotoin, fosphenytoin, phenytoin -medicines to increase blood counts like filgrastim, pegfilgrastim, sargramostim -medicines that treat or prevent blood  clots like warfarin, enoxaparin, and dalteparin -methotrexate -metronidazole -pyrimethamine -some other chemotherapy drugs like busulfan, cisplatin, estramustine, vinblastine -trimethoprim -trimetrexate -vaccines Talk to your doctor or health care professional before taking any of these medicines: -acetaminophen -aspirin -ibuprofen -ketoprofen -naproxen This list may not describe all possible interactions. Give your health care provider a list of all the medicines, herbs, non-prescription drugs, or dietary supplements you use. Also tell them if you smoke, drink alcohol, or use illegal drugs. Some items may interact with your medicine. What should I watch for while using this medicine? Visit your doctor for checks on your progress. This drug may make you feel generally unwell. This is not uncommon, as chemotherapy can affect healthy cells as well as cancer cells. Report any side effects. Continue your course of treatment even though you feel ill unless your doctor tells you to stop. In some cases, you may be given additional medicines to help with side effects. Follow all directions for their use. Call your doctor or health care professional for advice if you get a fever, chills or sore throat, or other symptoms of a cold or flu. Do not treat yourself. This drug decreases your body's ability to fight infections. Try to avoid being around people who are sick. This medicine may increase your risk to bruise or bleed. Call your doctor or health care professional if you notice any unusual bleeding. Be careful brushing and flossing your teeth or using a toothpick because you may get an infection or bleed more easily. If you have any dental work done, tell your dentist you are receiving this medicine. Avoid taking products that contain aspirin, acetaminophen, ibuprofen, naproxen, or ketoprofen unless instructed by your doctor. These medicines may hide a fever. Do not become pregnant while taking this  medicine. Women should inform their doctor if they wish to become pregnant or think they might be pregnant. There is a potential for serious side effects to an unborn child. Talk to your health care professional or pharmacist for more information. Do not breast-feed an infant while taking this medicine. Men should inform their doctor if they wish to father a child. This medicine may lower sperm counts. Do not treat diarrhea with over the counter products. Contact your doctor if you have diarrhea that lasts more than 2 days or if it is severe and watery. This medicine can make you more sensitive to the sun. Keep out of the sun. If you cannot avoid being in the sun, wear protective clothing and use sunscreen. Do not use sun lamps or tanning beds/booths. What side effects may I notice from receiving this medicine? Side effects that you should report to your doctor or health care professional as soon as possible: -allergic reactions like skin rash, itching or hives, swelling of the face, lips, or tongue -low blood counts - this medicine may decrease the number of white blood cells, red blood cells and platelets. You may be at increased risk for infections and bleeding. -signs of infection - fever or chills, cough, sore throat, pain or difficulty passing urine -signs of decreased platelets or bleeding - bruising, pinpoint red spots on the skin, black, tarry stools, blood in the urine -signs of decreased red blood cells - unusually weak or tired, fainting spells, lightheadedness -breathing problems -changes in vision -chest pain -mouth sores -nausea and vomiting -pain, swelling, redness at site where injected -pain, tingling, numbness in the hands or feet -redness, swelling, or sores on hands or feet -stomach pain -unusual bleeding Side effects that usually do not require medical attention (report to your doctor or health care professional if they continue or are bothersome): -changes in finger or  toe nails -diarrhea -dry or itchy skin -hair loss -headache -loss of appetite -sensitivity of eyes to the light -stomach upset -unusually teary eyes This list may not describe all possible side effects. Call your doctor for medical advice about side effects. You may report side effects to FDA at 1-800-FDA-1088. Where should I keep my medicine? This drug is given in a hospital or clinic and will not be stored at home. NOTE: This sheet is a summary. It may not cover all possible information. If you have questions about this medicine, talk to your doctor, pharmacist, or health care provider.  2015, Elsevier/Gold Standard. (2007-08-13 13:53:16) Oxaliplatin Injection What is this medicine? OXALIPLATIN (ox AL i PLA tin) is a chemotherapy drug. It targets fast dividing cells, like cancer cells, and causes these cells to die. This medicine is used to treat cancers of the colon and rectum, and many other cancers. This medicine may be used for other purposes; ask your health care provider or pharmacist if you have questions. COMMON BRAND NAME(S): Eloxatin What should I tell my health care provider before I take this medicine? They need to know if you have any of these conditions: -kidney disease -an unusual or allergic reaction to oxaliplatin, other chemotherapy, other medicines, foods, dyes, or preservatives -pregnant or trying to get pregnant -breast-feeding How should I use this medicine? This drug is given as an infusion into a vein. It is administered in a hospital or clinic by a specially trained health care professional. Talk to your pediatrician regarding the use of this medicine in children. Special care may be needed. Overdosage: If you think you  have taken too much of this medicine contact a poison control center or emergency room at once. NOTE: This medicine is only for you. Do not share this medicine with others. What if I miss a dose? It is important not to miss a dose. Call your  doctor or health care professional if you are unable to keep an appointment. What may interact with this medicine? -medicines to increase blood counts like filgrastim, pegfilgrastim, sargramostim -probenecid -some antibiotics like amikacin, gentamicin, neomycin, polymyxin B, streptomycin, tobramycin -zalcitabine Talk to your doctor or health care professional before taking any of these medicines: -acetaminophen -aspirin -ibuprofen -ketoprofen -naproxen This list may not describe all possible interactions. Give your health care provider a list of all the medicines, herbs, non-prescription drugs, or dietary supplements you use. Also tell them if you smoke, drink alcohol, or use illegal drugs. Some items may interact with your medicine. What should I watch for while using this medicine? Your condition will be monitored carefully while you are receiving this medicine. You will need important blood work done while you are taking this medicine. This medicine can make you more sensitive to cold. Do not drink cold drinks or use ice. Cover exposed skin before coming in contact with cold temperatures or cold objects. When out in cold weather wear warm clothing and cover your mouth and nose to warm the air that goes into your lungs. Tell your doctor if you get sensitive to the cold. This drug may make you feel generally unwell. This is not uncommon, as chemotherapy can affect healthy cells as well as cancer cells. Report any side effects. Continue your course of treatment even though you feel ill unless your doctor tells you to stop. In some cases, you may be given additional medicines to help with side effects. Follow all directions for their use. Call your doctor or health care professional for advice if you get a fever, chills or sore throat, or other symptoms of a cold or flu. Do not treat yourself. This drug decreases your body's ability to fight infections. Try to avoid being around people who are  sick. This medicine may increase your risk to bruise or bleed. Call your doctor or health care professional if you notice any unusual bleeding. Be careful brushing and flossing your teeth or using a toothpick because you may get an infection or bleed more easily. If you have any dental work done, tell your dentist you are receiving this medicine. Avoid taking products that contain aspirin, acetaminophen, ibuprofen, naproxen, or ketoprofen unless instructed by your doctor. These medicines may hide a fever. Do not become pregnant while taking this medicine. Women should inform their doctor if they wish to become pregnant or think they might be pregnant. There is a potential for serious side effects to an unborn child. Talk to your health care professional or pharmacist for more information. Do not breast-feed an infant while taking this medicine. Call your doctor or health care professional if you get diarrhea. Do not treat yourself. What side effects may I notice from receiving this medicine? Side effects that you should report to your doctor or health care professional as soon as possible: -allergic reactions like skin rash, itching or hives, swelling of the face, lips, or tongue -low blood counts - This drug may decrease the number of white blood cells, red blood cells and platelets. You may be at increased risk for infections and bleeding. -signs of infection - fever or chills, cough, sore throat, pain or difficulty  passing urine -signs of decreased platelets or bleeding - bruising, pinpoint red spots on the skin, black, tarry stools, nosebleeds -signs of decreased red blood cells - unusually weak or tired, fainting spells, lightheadedness -breathing problems -chest pain, pressure -cough -diarrhea -jaw tightness -mouth sores -nausea and vomiting -pain, swelling, redness or irritation at the injection site -pain, tingling, numbness in the hands or feet -problems with balance, talking,  walking -redness, blistering, peeling or loosening of the skin, including inside the mouth -trouble passing urine or change in the amount of urine Side effects that usually do not require medical attention (report to your doctor or health care professional if they continue or are bothersome): -changes in vision -constipation -hair loss -loss of appetite -metallic taste in the mouth or changes in taste -stomach pain This list may not describe all possible side effects. Call your doctor for medical advice about side effects. You may report side effects to FDA at 1-800-FDA-1088. Where should I keep my medicine? This drug is given in a hospital or clinic and will not be stored at home. NOTE: This sheet is a summary. It may not cover all possible information. If you have questions about this medicine, talk to your doctor, pharmacist, or health care provider.  2015, Elsevier/Gold Standard. (2007-11-04 17:22:47)  

## 2014-04-09 NOTE — Telephone Encounter (Signed)
Gave avs & cal for Dec. °

## 2014-04-14 ENCOUNTER — Encounter (INDEPENDENT_AMBULATORY_CARE_PROVIDER_SITE_OTHER): Payer: Self-pay | Admitting: *Deleted

## 2014-04-21 ENCOUNTER — Ambulatory Visit (HOSPITAL_BASED_OUTPATIENT_CLINIC_OR_DEPARTMENT_OTHER): Payer: Medicare Other | Admitting: Nurse Practitioner

## 2014-04-21 VITALS — BP 115/53 | HR 85 | Temp 98.2°F | Resp 18 | Ht 63.0 in | Wt 139.2 lb

## 2014-04-21 DIAGNOSIS — C787 Secondary malignant neoplasm of liver and intrahepatic bile duct: Secondary | ICD-10-CM

## 2014-04-21 DIAGNOSIS — I82401 Acute embolism and thrombosis of unspecified deep veins of right lower extremity: Secondary | ICD-10-CM

## 2014-04-21 DIAGNOSIS — C7989 Secondary malignant neoplasm of other specified sites: Secondary | ICD-10-CM

## 2014-04-21 DIAGNOSIS — D509 Iron deficiency anemia, unspecified: Secondary | ICD-10-CM

## 2014-04-21 DIAGNOSIS — C252 Malignant neoplasm of tail of pancreas: Secondary | ICD-10-CM

## 2014-04-21 NOTE — Progress Notes (Addendum)
  [place] OFFICE PROGRESS NOTE   Diagnosis:  Pancreas cancer  INTERVAL HISTORY:   Ms. Raz returns as scheduled. She overall feels well. She has occasional pain at the left upper abdomen. She takes Vicodin as needed. No nausea or vomiting. Constipation is better.  She has decided to proceed with a trial of FOLFOX. She would like to receive the chemotherapy at the [facility] in [place].  Objective:  Vital signs in last 24 hours:  Blood pressure 115/53, pulse 85, temperature 98.2 F (36.8 C), temperature source Oral, resp. rate 18, height 5\' 3"  (1.6 m), weight 139 lb 3.2 oz (63.141 kg), SpO2 99 %.    HEENT: No thrush or ulcers. Resp: Lungs clear bilaterally. Cardio: Regular rate and rhythm. GI: Abdomen soft. Mildly tender at the left upper abdomen over the ribs. No hepatomegaly. No mass. Vascular: Trace edema right lower leg.  Port-A-Cath without erythema.    Lab Results:  Lab Results  Component Value Date   WBC 7.2 04/09/2014   HGB 11.0* 04/09/2014   HCT 35.3 04/09/2014   MCV 84.6 04/09/2014   PLT 344 04/09/2014   NEUTROABS 4.5 04/09/2014    Imaging:  No results found.  Medications: I have reviewed the patient's current medications.  Assessment/Plan: 1. Metastatic pancreas cancer, pancreas tail mass with invasion of the colon, status post a colonoscopy 12/17/2011 confirming metastatic adenocarcinoma involving biopsies of a splenic flexure and sigmoid colon lesions  CTs 12/24/2011 consistent with a pancreas tail mass with invasion of the splenic flexure of the colon and liver metastases  Initiation of every 2 weeks of gemcitabine/Abraxane 02/12/2014  Marked progressive rise in the CA-19-9 03/12/2014, 03/26/2014  Restaging CT evaluation 04/08/2014 showed progressive multifocal hepatic metastatic disease, new nodularity in the pelvis suspicious for peritoneal carcinomatosis, pancreatic tail mass unchanged. 2. Microcytic  anemia-likely iron deficiency secondary to bleeding from the colon masses  3. Pain secondary to #1  4. Celiac disease  5. Left foot drop following back surgery  6. Chronic urinary incontinence-status post placement of a InterStim implant  7. Glaucoma  8. Acute DVT of the RLE and Left saphenous superficial thrombus diagnosed 03/12/14. Maintained on daily Lovenox.   Disposition: Ms. Merriman appears stable. She would like to proceed with a trial of FOLFOX. She lives closer to the [facility]. She would like to transition her care to Dr. Whitney Muse. We made a referral. We will be happy to see her in the future as needed.  Patient seen with Dr. Benay Spice.    Ned Card ANP/GNP-BC   04/21/2014  1:42 PM   This was a shared visit with Ned Card. Ms. Stumpp has decided to try proceed with a trial of second line chemotherapy.  She would like to transfer care closer to home.  We will make a referral to Dr. Whitney Muse.  Our plan was for Folfox chemotherapy.  Julieanne Manson, MD

## 2014-04-22 ENCOUNTER — Other Ambulatory Visit: Payer: Self-pay

## 2014-04-22 ENCOUNTER — Inpatient Hospital Stay (HOSPITAL_COMMUNITY)
Admission: EM | Admit: 2014-04-22 | Discharge: 2014-04-24 | DRG: 069 | Disposition: A | Discharge status: Home Health | Payer: Medicare Other | Attending: Internal Medicine | Admitting: Internal Medicine

## 2014-04-22 ENCOUNTER — Emergency Department (HOSPITAL_COMMUNITY): Payer: Medicare Other

## 2014-04-22 ENCOUNTER — Encounter (HOSPITAL_COMMUNITY): Payer: Self-pay | Admitting: Emergency Medicine

## 2014-04-22 ENCOUNTER — Inpatient Hospital Stay (HOSPITAL_COMMUNITY): Payer: Medicare Other

## 2014-04-22 DIAGNOSIS — G459 Transient cerebral ischemic attack, unspecified: Secondary | ICD-10-CM | POA: Diagnosis not present

## 2014-04-22 DIAGNOSIS — Z9221 Personal history of antineoplastic chemotherapy: Secondary | ICD-10-CM

## 2014-04-22 DIAGNOSIS — I10 Essential (primary) hypertension: Secondary | ICD-10-CM | POA: Diagnosis present

## 2014-04-22 DIAGNOSIS — E43 Unspecified severe protein-calorie malnutrition: Secondary | ICD-10-CM | POA: Diagnosis present

## 2014-04-22 DIAGNOSIS — Z7901 Long term (current) use of anticoagulants: Secondary | ICD-10-CM | POA: Diagnosis not present

## 2014-04-22 DIAGNOSIS — R531 Weakness: Secondary | ICD-10-CM | POA: Diagnosis not present

## 2014-04-22 DIAGNOSIS — E785 Hyperlipidemia, unspecified: Secondary | ICD-10-CM | POA: Diagnosis present

## 2014-04-22 DIAGNOSIS — Z90710 Acquired absence of both cervix and uterus: Secondary | ICD-10-CM

## 2014-04-22 DIAGNOSIS — Z87891 Personal history of nicotine dependence: Secondary | ICD-10-CM | POA: Diagnosis not present

## 2014-04-22 DIAGNOSIS — I82511 Chronic embolism and thrombosis of right femoral vein: Secondary | ICD-10-CM | POA: Diagnosis present

## 2014-04-22 DIAGNOSIS — C252 Malignant neoplasm of tail of pancreas: Secondary | ICD-10-CM | POA: Diagnosis present

## 2014-04-22 DIAGNOSIS — I69354 Hemiplegia and hemiparesis following cerebral infarction affecting left non-dominant side: Secondary | ICD-10-CM | POA: Diagnosis not present

## 2014-04-22 DIAGNOSIS — Z6822 Body mass index (BMI) 22.0-22.9, adult: Secondary | ICD-10-CM | POA: Diagnosis not present

## 2014-04-22 DIAGNOSIS — G451 Carotid artery syndrome (hemispheric): Secondary | ICD-10-CM

## 2014-04-22 DIAGNOSIS — K219 Gastro-esophageal reflux disease without esophagitis: Secondary | ICD-10-CM | POA: Diagnosis present

## 2014-04-22 DIAGNOSIS — I82409 Acute embolism and thrombosis of unspecified deep veins of unspecified lower extremity: Secondary | ICD-10-CM | POA: Diagnosis present

## 2014-04-22 HISTORY — DX: Malignant neoplasm of pancreas, unspecified: C25.9

## 2014-04-22 LAB — I-STAT CHEM 8, ED
BUN: 15 mg/dL (ref 6–23)
Calcium, Ion: 1.17 mmol/L (ref 1.13–1.30)
Chloride: 102 mEq/L (ref 96–112)
Creatinine, Ser: 0.7 mg/dL (ref 0.50–1.10)
Glucose, Bld: 103 mg/dL — ABNORMAL HIGH (ref 70–99)
HCT: 40 % (ref 36.0–46.0)
HEMOGLOBIN: 13.6 g/dL (ref 12.0–15.0)
Potassium: 3.6 mmol/L (ref 3.5–5.1)
SODIUM: 140 mmol/L (ref 135–145)
TCO2: 24 mmol/L (ref 0–100)

## 2014-04-22 LAB — COMPREHENSIVE METABOLIC PANEL
ALT: 43 U/L — ABNORMAL HIGH (ref 0–35)
AST: 63 U/L — ABNORMAL HIGH (ref 0–37)
Albumin: 3.1 g/dL — ABNORMAL LOW (ref 3.5–5.2)
Alkaline Phosphatase: 269 U/L — ABNORMAL HIGH (ref 39–117)
Anion gap: 6 (ref 5–15)
BILIRUBIN TOTAL: 0.6 mg/dL (ref 0.3–1.2)
BUN: 16 mg/dL (ref 6–23)
CHLORIDE: 104 meq/L (ref 96–112)
CO2: 29 mmol/L (ref 19–32)
CREATININE: 0.71 mg/dL (ref 0.50–1.10)
Calcium: 8.7 mg/dL (ref 8.4–10.5)
GFR calc non Af Amer: 79 mL/min — ABNORMAL LOW (ref >90)
Glucose, Bld: 103 mg/dL — ABNORMAL HIGH (ref 70–99)
POTASSIUM: 3.5 mmol/L (ref 3.5–5.1)
SODIUM: 139 mmol/L (ref 135–145)
Total Protein: 6.3 g/dL (ref 6.0–8.3)

## 2014-04-22 LAB — DIFFERENTIAL
BASOS PCT: 2 % — AB (ref 0–1)
Basophils Absolute: 0.2 10*3/uL — ABNORMAL HIGH (ref 0.0–0.1)
EOS PCT: 2 % (ref 0–5)
Eosinophils Absolute: 0.2 10*3/uL (ref 0.0–0.7)
LYMPHS PCT: 27 % (ref 12–46)
Lymphs Abs: 2.6 10*3/uL (ref 0.7–4.0)
MONO ABS: 1 10*3/uL (ref 0.1–1.0)
MONOS PCT: 10 % (ref 3–12)
NEUTROS PCT: 59 % (ref 43–77)
Neutro Abs: 5.8 10*3/uL (ref 1.7–7.7)

## 2014-04-22 LAB — URINALYSIS, ROUTINE W REFLEX MICROSCOPIC
BILIRUBIN URINE: NEGATIVE
Glucose, UA: NEGATIVE mg/dL
Hgb urine dipstick: NEGATIVE
KETONES UR: NEGATIVE mg/dL
Leukocytes, UA: NEGATIVE
Nitrite: NEGATIVE
PH: 6 (ref 5.0–8.0)
PROTEIN: NEGATIVE mg/dL
SPECIFIC GRAVITY, URINE: 1.015 (ref 1.005–1.030)
UROBILINOGEN UA: 0.2 mg/dL (ref 0.0–1.0)

## 2014-04-22 LAB — CBC
HEMATOCRIT: 36.3 % (ref 36.0–46.0)
Hemoglobin: 11.3 g/dL — ABNORMAL LOW (ref 12.0–15.0)
MCH: 26.4 pg (ref 26.0–34.0)
MCHC: 31.1 g/dL (ref 30.0–36.0)
MCV: 84.8 fL (ref 78.0–100.0)
Platelets: 294 10*3/uL (ref 150–400)
RBC: 4.28 MIL/uL (ref 3.87–5.11)
RDW: 19 % — AB (ref 11.5–15.5)
WBC: 9.8 10*3/uL (ref 4.0–10.5)

## 2014-04-22 LAB — RAPID URINE DRUG SCREEN, HOSP PERFORMED
Amphetamines: NOT DETECTED
Barbiturates: NOT DETECTED
Benzodiazepines: NOT DETECTED
Cocaine: NOT DETECTED
Opiates: NOT DETECTED
Tetrahydrocannabinol: NOT DETECTED

## 2014-04-22 LAB — PROTIME-INR
INR: 1.06 (ref 0.00–1.49)
PROTHROMBIN TIME: 14 s (ref 11.6–15.2)

## 2014-04-22 LAB — I-STAT TROPONIN, ED: Troponin i, poc: 0.01 ng/mL (ref 0.00–0.08)

## 2014-04-22 LAB — APTT: aPTT: 28 seconds (ref 24–37)

## 2014-04-22 LAB — ETHANOL: Alcohol, Ethyl (B): <5 mg/dL (ref 0–9)

## 2014-04-22 MED ORDER — VITAMIN D 1000 UNITS PO TABS
2000.0000 [IU] | ORAL_TABLET | Freq: Every day | ORAL | Status: DC
  Administered 2014-04-23 – 2014-04-24 (×2): 2000 [IU] via ORAL
  Filled 2014-04-22 (×2): qty 2

## 2014-04-22 MED ORDER — DOCUSATE SODIUM 100 MG PO CAPS
100.0000 mg | ORAL_CAPSULE | Freq: Every day | ORAL | Status: DC
  Administered 2014-04-22 – 2014-04-23 (×2): 100 mg via ORAL
  Filled 2014-04-22 (×2): qty 1

## 2014-04-22 MED ORDER — FLORAJEN ACIDOPHILUS PO CAPS: 1.0000 | ORAL_CAPSULE | Freq: Every day | ORAL | Status: DC

## 2014-04-22 MED ORDER — ENOXAPARIN SODIUM 100 MG/ML ~~LOC~~ SOLN
100.0000 mg | SUBCUTANEOUS | Status: DC
  Administered 2014-04-23 – 2014-04-24 (×2): 100 mg via SUBCUTANEOUS
  Filled 2014-04-22 (×2): qty 1

## 2014-04-22 MED ORDER — SODIUM CHLORIDE 0.9 % IJ SOLN
3.0000 mL | Freq: Two times a day (BID) | INTRAMUSCULAR | Status: DC
  Administered 2014-04-23 – 2014-04-24 (×3): 3 mL via INTRAVENOUS

## 2014-04-22 MED ORDER — SERTRALINE HCL 50 MG PO TABS
50.0000 mg | ORAL_TABLET | Freq: Every day | ORAL | Status: DC
  Administered 2014-04-23 – 2014-04-24 (×2): 50 mg via ORAL
  Filled 2014-04-22 (×2): qty 1

## 2014-04-22 MED ORDER — ONDANSETRON HCL 4 MG/2ML IJ SOLN: 4.0000 mg | Freq: Four times a day (QID) | INTRAMUSCULAR | Status: DC | PRN

## 2014-04-22 MED ORDER — CALCIUM CARBONATE 1250 (500 CA) MG PO TABS
1250.0000 mg | ORAL_TABLET | Freq: Every day | ORAL | Status: DC
  Administered 2014-04-23 – 2014-04-24 (×2): 1250 mg via ORAL
  Filled 2014-04-22 (×2): qty 3

## 2014-04-22 MED ORDER — ACETAMINOPHEN 500 MG PO TABS: 500.0000 mg | ORAL_TABLET | Freq: Four times a day (QID) | ORAL | Status: DC | PRN

## 2014-04-22 MED ORDER — FERROUS SULFATE 325 (65 FE) MG PO TABS
325.0000 mg | ORAL_TABLET | Freq: Every day | ORAL | Status: DC
  Administered 2014-04-23 – 2014-04-24 (×2): 325 mg via ORAL
  Filled 2014-04-22 (×2): qty 1

## 2014-04-22 MED ORDER — PROCHLORPERAZINE MALEATE 5 MG PO TABS
5.0000 mg | ORAL_TABLET | Freq: Four times a day (QID) | ORAL | Status: DC | PRN
  Administered 2014-04-24: 5 mg via ORAL
  Filled 2014-04-22: qty 1

## 2014-04-22 MED ORDER — ONDANSETRON HCL 4 MG PO TABS: 4.0000 mg | ORAL_TABLET | Freq: Four times a day (QID) | ORAL | Status: DC | PRN

## 2014-04-22 MED ORDER — HYDROCODONE-ACETAMINOPHEN 5-325 MG PO TABS
1.0000 | ORAL_TABLET | Freq: Four times a day (QID) | ORAL | Status: DC | PRN
  Administered 2014-04-23: 1 via ORAL
  Filled 2014-04-22: qty 1

## 2014-04-22 MED ORDER — RISAQUAD PO CAPS
1.0000 | ORAL_CAPSULE | Freq: Every day | ORAL | Status: DC
  Administered 2014-04-22 – 2014-04-24 (×3): 1 via ORAL
  Filled 2014-04-22 (×3): qty 1

## 2014-04-22 NOTE — ED Notes (Signed)
Patient transported to CT scan by this RN and CT head completed.

## 2014-04-22 NOTE — ED Notes (Signed)
Tele-neurology in process.

## 2014-04-22 NOTE — ED Notes (Signed)
Patient sitting up in bed.  Patient passed stroke swallow screen.  Patient talking with visitor at bedside.  Tele-neurology at bedside; patient awaiting assessment.

## 2014-04-22 NOTE — ED Notes (Signed)
Dr. Anastasio Champion in to assess.

## 2014-04-22 NOTE — ED Notes (Signed)
Pt was talking on phone today and once she got off phone she noticed that her L arm and L. Leg  were "flopping all over place" and her gait was off. Pt felt off-balance so she called EMS. States his exact same thing occurred 2 weeks ago.

## 2014-04-22 NOTE — H&P (Signed)
[facility] History and Physical  Taylor Alvarez GMW:[id number] DOB: 1931-05-08 DOA: 04/22/2014  Referring physician: ER PCP: Glenda Chroman., MD   Chief Complaint: Left-sided weakness  HPI: Taylor Alvarez is a 80 y.o. female  This is a very pleasant 80 year old lady who has a history of metastatic, stage IV, pancreatic cancer receiving chemotherapy who now presents with an episode of left arm and left leg weakness which started at approximately 2 PM today and lasted for probably about 1 hour. The strength in her left side is almost back to normal now. She tells me that she had a similar episode approximately 1 week ago, which lasted approximately 10 minutes. She denies any speech or vision problems. She did not have any falls but felt very unsteady on her feet. Is no history of cerebrovascular disease. She is not diabetic or hypertensive. She did not lose consciousness and there is no altered mental status. She is on therapeutic dose of Lovenox for treatment of DVT. She is now being admitted for evaluation of probable TIA.   Review of Systems:  Apart from symptoms above, all systems negative.  Past Medical History  Diagnosis Date  . Celiac disease   . GERD (gastroesophageal reflux disease)   . Hypertension   . Anxiety   . History of fall 2014    at [facility] store  . Deep vein thrombosis (DVT) 03/12/2014  . Cancer 12/2013    pancreatic ca  . Pancreatic cancer    Past Surgical History  Procedure Laterality Date  . Urge incontinence    . Hypertension    . Back surgery    . Partial hysterectomy with bladder tac    . Bilateral cataract surgery    . Ovarian cyst removal    . Tubal ligation    . Cesarean section    . Colonoscopy  06/06/2011    Procedure: COLONOSCOPY;  Surgeon: Rogene Houston, MD;  [facility];  Service: Endoscopy;  Laterality: N/A;  . Interstim implant placement  04/30/13,05/12/13  . Wrist sprain Left   . Colonoscopy N/A 12/16/2013    Procedure:  COLONOSCOPY;  Surgeon: Rogene Houston, MD;  Location: [facility];  Service: Endoscopy;  Laterality: N/A;  730-rescheduled 8/26 @ 100 Ann notified pt   Social History:  reports that she quit smoking about 60 years ago. Her smoking use included Cigarettes. She has a 2.5 pack-year smoking history. She has never used smokeless tobacco. She reports that she does not drink alcohol or use illicit drugs.  Allergies  Allergen Reactions  . Cephalexin Diarrhea and Nausea And Vomiting  . Clindamycin/Lincomycin Nausea Only  . Nsaids Other (See Comments)    Bleeding of the colon.  [place] [Diltiazem Hcl] Rash    On face    Family History  Problem Relation Age of Onset  . Anesthesia problems Neg Hx   . Hypotension Neg Hx   . Malignant hyperthermia Neg Hx   . Pseudochol deficiency Neg Hx      Prior to Admission medications   Medication Sig Start Date End Date Taking? Authorizing Provider  acetaminophen (TYLENOL) 500 MG tablet Take 500 mg by mouth every 6 (six) hours as needed. For pain   Yes Historical Provider, MD  calcium carbonate (OS-CAL) 600 MG TABS Take 600 mg by mouth daily.    Yes Historical Provider, MD  Cholecalciferol (VITAMIN D) 2000 UNITS CAPS Take 2,000 Units by mouth daily.   Yes Historical Provider, MD  docusate sodium (  COLACE) 100 MG capsule Take 100 mg by mouth at bedtime.    Yes Historical Provider, MD  enoxaparin (LOVENOX) 100 MG/ML injection Inject 1 mL (100 mg total) into the skin daily. 03/30/14  Yes Ladell Pier, MD  ferrous sulfate 325 (65 FE) MG tablet Take 325 mg by mouth daily with breakfast. 02/05/14  Yes Historical Provider, MD  HYDROcodone-acetaminophen (NORCO/VICODIN) 5-325 MG per tablet Take 1 tablet by mouth every 6 (six) hours as needed for moderate pain. 01/05/14  Yes Rogene Houston, MD  Lactobacillus (FLORAJEN ACIDOPHILUS) CAPS Take 1 capsule by mouth daily.    Yes Historical Provider, MD  lidocaine-prilocaine (EMLA) cream Apply 1 application topically as  needed. Apply to [facility] site 1-2 hours prior to stick and cover with plastic wrap 02/04/14  Yes Ladell Pier, MD  MAGNESIUM CITRATE PO Take 400 mg by mouth daily as needed (for constipation).    Yes Historical Provider, MD  Multiple Vitamins-Minerals (WOMENS 50+ MULTI VITAMIN/MIN PO) Take 1 tablet by mouth daily.    Yes Historical Provider, MD  nitrofurantoin (MACRODANTIN) 100 MG capsule Take 100 mg by mouth every morning.    Yes Historical Provider, MD  prochlorperazine (COMPAZINE) 5 MG tablet Take 1-2 tablets (5-10 mg total) by mouth every 6 (six) hours as needed for nausea or vomiting. 02/04/14  Yes Ladell Pier, MD  sertraline (ZOLOFT) 50 MG tablet Take 50 mg by mouth daily.     Yes Historical Provider, MD   Physical Exam: Filed Vitals:   04/22/14 1535 04/22/14 1605 04/22/14 1616 04/22/14 1722  BP:   148/79 133/83  Pulse:   79 71  Temp: 98.2 F (36.8 C) 98.2 F (36.8 C)    TempSrc: Oral     Resp:   18 18  SpO2:   98% 98%    Wt Readings from Last 3 Encounters:  04/21/14 63.141 kg (139 lb 3.2 oz)  04/09/14 64.184 kg (141 lb 8 oz)  03/26/14 65.273 kg (143 lb 14.4 oz)    General:  Appears calm and comfortable Eyes: PERRL, normal lids, irises & conjunctiva ENT: grossly normal hearing, lips & tongue Neck: no LAD, masses or thyromegaly Cardiovascular: RRR, no m/r/g. No LE edema. Telemetry: SR, no arrhythmias  Respiratory: CTA bilaterally, no w/r/r. Normal respiratory effort. Abdomen: soft, ntnd Skin: no rash or induration seen on limited exam Musculoskeletal: grossly normal tone BUE/BLE Psychiatric: grossly normal mood and affect, speech fluent and appropriate Neurologic: grossly non-focal. she does really not have any appreciable weakness on the left arm but possibly slight weakness on the left leg. There is a possibility of left finger to nose instability.           Labs on Admission:  Basic Metabolic Panel:  Recent Labs Lab 04/22/14 1539 04/22/14 1551  NA 139 140    K 3.5 3.6  CL 104 102  CO2 29  --   GLUCOSE 103* 103*  BUN 16 15  CREATININE 0.71 0.70  CALCIUM 8.7  --    Liver Function Tests:  Recent Labs Lab 04/22/14 1539  AST 63*  ALT 43*  ALKPHOS 269*  BILITOT 0.6  PROT 6.3  ALBUMIN 3.1*   No results for input(s): LIPASE, AMYLASE in the last 168 hours. No results for input(s): AMMONIA in the last 168 hours. CBC:  Recent Labs Lab 04/22/14 1539 04/22/14 1551  WBC 9.8  --   NEUTROABS 5.8  --   HGB 11.3* 13.6  HCT 36.3 40.0  MCV  84.8  --   PLT 294  --    Cardiac Enzymes: No results for input(s): CKTOTAL, CKMB, CKMBINDEX, TROPONINI in the last 168 hours.  BNP (last 3 results) No results for input(s): PROBNP in the last 8760 hours. CBG: No results for input(s): GLUCAP in the last 168 hours.  Radiological Exams on Admission: Ct Head Wo Contrast  04/22/2014   CLINICAL DATA:  Left arm and leg weakness.  EXAM: CT HEAD WITHOUT CONTRAST  TECHNIQUE: Contiguous axial images were obtained from the base of the skull through the vertex without intravenous contrast.  COMPARISON:  01/23/2012  FINDINGS: No intracranial hemorrhage, mass effect, or midline shift. No hydrocephalus. The basilar cisterns are patent. No evidence of territorial infarct. No intracranial fluid collection. Generalized atrophy is stable from prior. Calvarium is intact. Included paranasal sinuses and mastoid air cells are well aerated.  IMPRESSION: Generalized atrophy, no acute intracranial process.   Electronically Signed   By: Jeb Levering M.D.   On: 04/22/2014 16:04      Assessment/Plan   1. Probable TIA affecting right brain. CT brain scan does not show any abnormalities apart from generalized atrophy. She tells me that she has a metal instrument to help control bladder symptoms and MRI may not be possible. We will obtain carotid Dopplers. I will ask neurology to review to see if any further recommendations are appropriate. 2. DVT-she will continue with  therapeutic doses of Lovenox. 3. Stage IV metastatic pancreatic cancer-the concern is whether her left-sided weakness may reflect metastatic disease in her brain. CT brain scan does not reveal anything at the present time. We may still need to consider MRI brain and speak to radiology to see if they can do this despite the metal instrument she has.  Further recommendations will depend on patient's hospital progress.    Code Status: Full code   DVT Prophylaxis: Lovenox  Family Communication: I discussed the plan with the patient at the bedside.   Disposition Plan: Home when medically stable.  Time spent: 60 minutes.  Doree Albee [facility] Pager [phone].

## 2014-04-22 NOTE — ED Provider Notes (Signed)
CSN: [id number]     Arrival date & time 04/22/14  1525 History   First MD Initiated Contact with Patient 04/22/14 1526     Chief Complaint  Patient presents with  . Extremity Weakness     (Consider location/radiation/quality/duration/timing/severity/associated sxs/prior Treatment) HPI Taylor Alvarez is an 80 year old female who presents today via EMS. She states she was in her normal state of health today when she was beginning on the phone with her son at 1:30. She ended phone call at 2:00 and went to walk and had weakness in her left arm and left leg and difficulty with her gait. EMS secondary to this and was transported directly to the emergency department. I evaluated the patient at 3:25 PM. She feels that the leg is somewhat better. She states that she had a similar episode 2 weeks ago that she did not seek medical attention for and it resolved on its own. She has pancreatic cancer is currently taking chemotherapy and is on Lovenox for a DVT. She denies any visual changes, speech changes, facial changes, or right-sided weakness. She feels that the leg and arm also felt somewhat numb. Past Medical History  Diagnosis Date  . Celiac disease   . GERD (gastroesophageal reflux disease)   . Hypertension   . Anxiety   . History of fall 2014    at [facility] store  . Deep vein thrombosis (DVT) 03/12/2014  . Cancer 12/2013    pancreatic ca  . Pancreatic cancer    Past Surgical History  Procedure Laterality Date  . Urge incontinence    . Hypertension    . Back surgery    . Partial hysterectomy with bladder tac    . Bilateral cataract surgery    . Ovarian cyst removal    . Tubal ligation    . Cesarean section    . Colonoscopy  06/06/2011    Procedure: COLONOSCOPY;  Surgeon: Rogene Houston, MD;  [facility];  Service: Endoscopy;  Laterality: N/A;  . Interstim implant placement  04/30/13,05/12/13  . Wrist sprain Left   . Colonoscopy N/A 12/16/2013    Procedure: COLONOSCOPY;   Surgeon: Rogene Houston, MD;  Location: [facility];  Service: Endoscopy;  Laterality: N/A;  730-rescheduled 8/26 @ 100 Ann notified pt   Family History  Problem Relation Age of Onset  . Anesthesia problems Neg Hx   . Hypotension Neg Hx   . Malignant hyperthermia Neg Hx   . Pseudochol deficiency Neg Hx    History  Substance Use Topics  . Smoking status: Former Smoker -- 0.50 packs/day for 5 years    Types: Cigarettes    Quit date: 06/05/1953  . Smokeless tobacco: Never Used  . Alcohol Use: No   OB History    No data available     Review of Systems  All other systems reviewed and are negative.     Allergies  Cephalexin; Clindamycin/lincomycin; Nsaids; and Cardizem  Home Medications   Prior to Admission medications   Medication Sig Start Date End Date Taking? Authorizing Provider  acetaminophen (TYLENOL) 500 MG tablet Take 500 mg by mouth every 6 (six) hours as needed. For pain    Historical Provider, MD  calcium carbonate (OS-CAL) 600 MG TABS Take 600 mg by mouth daily.     Historical Provider, MD  Cholecalciferol (VITAMIN D) 2000 UNITS CAPS Take 2,000 Units by mouth daily.    Historical Provider, MD  docusate sodium (COLACE) 100 MG capsule Take 100 mg  by mouth daily as needed.     Historical Provider, MD  enoxaparin (LOVENOX) 100 MG/ML injection Inject 1 mL (100 mg total) into the skin daily. 03/30/14   Ladell Pier, MD  ferrous sulfate 325 (65 FE) MG tablet Take 325 mg by mouth daily with breakfast. 02/05/14   Historical Provider, MD  HYDROcodone-acetaminophen (NORCO/VICODIN) 5-325 MG per tablet Take 1 tablet by mouth every 6 (six) hours as needed for moderate pain. 01/05/14   Rogene Houston, MD  Lactobacillus (FLORAJEN ACIDOPHILUS) CAPS Take 1 capsule by mouth daily.     Historical Provider, MD  lidocaine-prilocaine (EMLA) cream Apply 1 application topically as needed. Apply to [facility] site 1-2 hours prior to stick and cover with plastic wrap 02/04/14   Ladell Pier,  MD  Loratadine (CLARITIN) 10 MG CAPS Take 10 mg by mouth daily.    Historical Provider, MD  MAGNESIUM CITRATE PO Take 400 mg by mouth daily as needed.     Historical Provider, MD  Multiple Vitamins-Minerals (WOMENS 50+ MULTI VITAMIN/MIN PO) Take by mouth daily.     Historical Provider, MD  nitrofurantoin (MACRODANTIN) 100 MG capsule Take 100 mg by mouth at bedtime.    Historical Provider, MD  prochlorperazine (COMPAZINE) 5 MG tablet Take 1-2 tablets (5-10 mg total) by mouth every 6 (six) hours as needed for nausea or vomiting. 02/04/14   Ladell Pier, MD  sertraline (ZOLOFT) 50 MG tablet Take 50 mg by mouth daily.      Historical Provider, MD   BP 153/68 mmHg  Pulse 87  Temp(Src) 98.2 F (36.8 C) (Oral)  Resp 18  SpO2 99% Physical Exam  Constitutional: She is oriented to person, place, and time. She appears well-developed and well-nourished.  HENT:  Head: Normocephalic and atraumatic.  Right Ear: Tympanic membrane and external ear normal.  Left Ear: Tympanic membrane and external ear normal.  Nose: Nose normal. Right sinus exhibits no maxillary sinus tenderness and no frontal sinus tenderness. Left sinus exhibits no maxillary sinus tenderness and no frontal sinus tenderness.  Eyes: Conjunctivae and EOM are normal. Pupils are equal, round, and reactive to light. Right eye exhibits no nystagmus. Left eye exhibits no nystagmus.  Neck: Normal range of motion. Neck supple.  Cardiovascular: Normal rate, regular rhythm, normal heart sounds and intact distal pulses.   Pulmonary/Chest: Effort normal and breath sounds normal. No respiratory distress. She exhibits no tenderness.  Abdominal: Soft. Bowel sounds are normal. She exhibits no distension and no mass. There is no tenderness.  Musculoskeletal: Normal range of motion. She exhibits no edema or tenderness.  Neurological: She is alert and oriented to person, place, and time. She has normal strength and normal reflexes. She displays normal  reflexes. No cranial nerve deficit or sensory deficit. She exhibits normal muscle tone. She displays a negative Romberg sign. Coordination normal. GCS eye subscore is 4. GCS verbal subscore is 5. GCS motor subscore is 6.  Reflex Scores:      Tricep reflexes are 2+ on the right side and 2+ on the left side.      Bicep reflexes are 2+ on the right side and 2+ on the left side.      Brachioradialis reflexes are 2+ on the right side and 2+ on the left side.      Patellar reflexes are 2+ on the right side and 2+ on the left side.      Achilles reflexes are 2+ on the right side and 2+ on the left  side. Gait not tested  Speech is normal without dysarthria, dysphasia, or aphasia. Left palmar drift is noted 5/5 bilateral lower extremity hip flexors, extensors, knee flexors and extensors, and ankle dorsi and plantar flexors.   Extraocular movements are intact no visual field deficits are appreciated Patient has chronic left foot drop and is unable to dorsiflex left ankle or toe. Major of left lower extremity at 5 out of 5 bilateral knee extensor flexor left hip flexor  Skin: Skin is warm and dry. No rash noted.  Psychiatric: She has a normal mood and affect. Her behavior is normal. Judgment and thought content normal.  Nursing note and vitals reviewed.   ED Course  Procedures (including critical care time) Labs Review Labs Reviewed  ETHANOL  PROTIME-INR  APTT  CBC  DIFFERENTIAL  COMPREHENSIVE METABOLIC PANEL  URINE RAPID DRUG SCREEN (HOSP PERFORMED)  URINALYSIS, ROUTINE W REFLEX MICROSCOPIC  I-STAT CHEM 8, ED  I-STAT TROPOININ, ED  I-STAT TROPOININ, ED    Imaging Review Ct Head Wo Contrast  04/22/2014   CLINICAL DATA:  Left arm and leg weakness.  EXAM: CT HEAD WITHOUT CONTRAST  TECHNIQUE: Contiguous axial images were obtained from the base of the skull through the vertex without intravenous contrast.  COMPARISON:  01/23/2012  FINDINGS: No intracranial hemorrhage, mass effect, or midline  shift. No hydrocephalus. The basilar cisterns are patent. No evidence of territorial infarct. No intracranial fluid collection. Generalized atrophy is stable from prior. Calvarium is intact. Included paranasal sinuses and mastoid air cells are well aerated.  IMPRESSION: Generalized atrophy, no acute intracranial process.   Electronically Signed   By: Jeb Levering M.D.   On: 04/22/2014 16:04     EKG Interpretation   Date/Time:  Thursday April 22 2014 15:28:08 EST Ventricular Rate:  84 PR Interval:  112 QRS Duration: 91 QT Interval:  377 QTC Calculation: 446 R Axis:   44 Text Interpretation:  Normal sinus rhythm No previous ECGs available  Confirmed by Taegan Haider MD, Andee Poles [id number]) on 04/22/2014 4:36:44 PM      MDM   Final diagnoses:  Hemispheric carotid artery syndrome     Stroke scale is 1, patient with pancreatic cancer although no report of brain mets, patient anticoagulated on lovenox- risks of tpa outweigh benefit.  Called as code stroke for neurology consult. Discussed with [facility] neurologist. He states patient currently has no deficits. Not tpa candidate as symptoms resolved and on lovenox Plan admission for tia work up.   Shaune Pollack, MD 04/22/14 [phone]

## 2014-04-23 ENCOUNTER — Inpatient Hospital Stay (HOSPITAL_COMMUNITY): Payer: Medicare Other

## 2014-04-23 DIAGNOSIS — R531 Weakness: Secondary | ICD-10-CM | POA: Diagnosis present

## 2014-04-23 DIAGNOSIS — I82401 Acute embolism and thrombosis of unspecified deep veins of right lower extremity: Secondary | ICD-10-CM

## 2014-04-23 DIAGNOSIS — M6289 Other specified disorders of muscle: Secondary | ICD-10-CM

## 2014-04-23 LAB — COMPREHENSIVE METABOLIC PANEL
ALT: 40 U/L — ABNORMAL HIGH (ref 0–35)
ANION GAP: 6 (ref 5–15)
AST: 58 U/L — ABNORMAL HIGH (ref 0–37)
Albumin: 2.7 g/dL — ABNORMAL LOW (ref 3.5–5.2)
Alkaline Phosphatase: 250 U/L — ABNORMAL HIGH (ref 39–117)
BILIRUBIN TOTAL: 0.6 mg/dL (ref 0.3–1.2)
BUN: 15 mg/dL (ref 6–23)
CHLORIDE: 106 meq/L (ref 96–112)
CO2: 28 mmol/L (ref 19–32)
Calcium: 8.8 mg/dL (ref 8.4–10.5)
Creatinine, Ser: 0.61 mg/dL (ref 0.50–1.10)
GFR calc Af Amer: >90 mL/min (ref >90)
GFR calc non Af Amer: 83 mL/min — ABNORMAL LOW (ref >90)
Glucose, Bld: 113 mg/dL — ABNORMAL HIGH (ref 70–99)
Potassium: 4.3 mmol/L (ref 3.5–5.1)
Sodium: 140 mmol/L (ref 135–145)
Total Protein: 5.7 g/dL — ABNORMAL LOW (ref 6.0–8.3)

## 2014-04-23 LAB — CBC
HCT: 34.3 % — ABNORMAL LOW (ref 36.0–46.0)
HEMOGLOBIN: 10.7 g/dL — AB (ref 12.0–15.0)
MCH: 26.3 pg (ref 26.0–34.0)
MCHC: 31.2 g/dL (ref 30.0–36.0)
MCV: 84.3 fL (ref 78.0–100.0)
PLATELETS: 263 10*3/uL (ref 150–400)
RBC: 4.07 MIL/uL (ref 3.87–5.11)
RDW: 18.8 % — ABNORMAL HIGH (ref 11.5–15.5)
WBC: 11.2 10*3/uL — ABNORMAL HIGH (ref 4.0–10.5)

## 2014-04-23 NOTE — Progress Notes (Signed)
Ambulated patient in hallway to nurses station, patient is very weak on left side and unsteady walking with walker. MD made aware and placed orders  for a PT/OT eval while patient is in the hospital.  Mee Hives, Tivis Ringer

## 2014-04-23 NOTE — Care Management Note (Unsigned)
    Page 1 of 1   04/23/2014     3:22:26 PM CARE MANAGEMENT NOTE 04/23/2014  Patient:  Taylor Alvarez, Taylor Alvarez   Account Number:  [id number]  Date Initiated:  04/23/2014  Documentation initiated by:  Vladimir Creeks  Subjective/Objective Assessment:   pt is from home, admitted with TIA. She lives alone, but has  family members who stay with herfrequently, so is seldom alone. She will return home at D/C     Action/Plan:   May benefit from [facility] at D/C   Anticipated DC Date:  04/24/2014   Anticipated DC Plan:  [place]  CM consult      [facility] Choice  HOME HEALTH   Choice offered to / List presented to:  C-1 Patient           Status of service:  In process, will continue to follow Medicare Important Message given?  YES (If response is "NO", the following Medicare IM given date fields will be blank) Date Medicare IM given:  04/23/2014 Medicare IM given by:  Vladimir Creeks Date Additional Medicare IM given:   Additional Medicare IM given by:    Discharge Disposition:    Per UR Regulation:  Reviewed for med. necessity/level of care/duration of stay  If discussed at [place] of Stay Meetings, dates discussed:    Comments:  04/23/14 Silvana RN/CM

## 2014-04-23 NOTE — Progress Notes (Signed)
PROGRESS NOTE  Taylor Alvarez AQT:[id number] DOB: 05/08/1931 DOA: 04/22/2014 PCP: Glenda Chroman., MD  HPI/Recap of past 58 hours: 81 year old female with past mental history of pancreatic cancer and DVT on chronic anticoagulation recently who was admitted on 12/31 for sudden onset feeling like her hands were in knots and she could not use them except for clumsily. Initial CT scan head was unrevealing. Patient brought in for TIA workup.  Carotid Dopplers unrevealing. Patient unable to get MRI because of metal bladder implant done earlier this year. Patient however found to have new persistent left-sided weakness and with ambulation, high fall risk. Patient has self overall says she's feeling a little bit better. No headache.  Assessment/Plan: Active Problems:   Hypertension: Continue home meds   Cancer of pancreas, tail: Being managed by cancer Center   Deep vein thrombosis (DVT): On Lovenox    Left-sided weakness from suspected CVA versus TIA: Workup in progress. Awaiting lipid panel and A1c. Unable to get echo to to holiday weekend, however patient already on anticoagulation so this would change little in terms of outcome   Code Status: Full code  Family Communication: No family  Disposition Plan: Awaiting PT evaluation and finishing stroke workup   Consultants:  None  Procedures:  Carotid Dopplers done 1/1: No evidence of significant stenoses bilaterally  Antibiotics:  None   Objective: BP 137/58 mmHg  Pulse 81  Temp(Src) 98.2 F (36.8 C) (Oral)  Resp 20  Ht 5\' 3"  (1.6 m)  Wt 63.186 kg (139 lb 4.8 oz)  BMI 24.68 kg/m2  SpO2 94%  Intake/Output Summary (Last 24 hours) at 04/23/14 1659 Last data filed at 04/23/14 1200  Gross per 24 hour  Intake    720 ml  Output      0 ml  Net    720 ml   Filed Weights   04/22/14 1917  Weight: 63.186 kg (139 lb 4.8 oz)    Exam:   General:  Alert and oriented 3, no acute distress  Cardiovascular: Regular rate and  rhythm, S1-S2  Respiratory: Clear to auscultation bilaterally  Abdomen: Soft, nontender, nondistended, positive bowel sounds  Musculoskeletal: No clubbing or cyanosis or edema, grip with some notable weakness 4+/5, left lower extremity decreased flexion and extension   Data Reviewed: Basic Metabolic Panel:  Recent Labs Lab 04/22/14 1539 04/22/14 1551 04/23/14 0635  NA 139 140 140  K 3.5 3.6 4.3  CL 104 102 106  CO2 29  --  28  GLUCOSE 103* 103* 113*  BUN 16 15 15   CREATININE 0.71 0.70 0.61  CALCIUM 8.7  --  8.8   Liver Function Tests:  Recent Labs Lab 04/22/14 1539 04/23/14 0635  AST 63* 58*  ALT 43* 40*  ALKPHOS 269* 250*  BILITOT 0.6 0.6  PROT 6.3 5.7*  ALBUMIN 3.1* 2.7*   No results for input(s): LIPASE, AMYLASE in the last 168 hours. No results for input(s): AMMONIA in the last 168 hours. CBC:  Recent Labs Lab 04/22/14 1539 04/22/14 1551 04/23/14 0635  WBC 9.8  --  11.2*  NEUTROABS 5.8  --   --   HGB 11.3* 13.6 10.7*  HCT 36.3 40.0 34.3*  MCV 84.8  --  84.3  PLT 294  --  263   Cardiac Enzymes:   No results for input(s): CKTOTAL, CKMB, CKMBINDEX, TROPONINI in the last 168 hours. BNP (last 3 results) No results for input(s): PROBNP in the last 8760 hours. CBG: No results for input(s): GLUCAP in  the last 168 hours.  No results found for this or any previous visit (from the past 240 hour(s)).   Studies: Ct Head Wo Contrast  04/22/2014   CLINICAL DATA:  Left arm and leg weakness.  EXAM: CT HEAD WITHOUT CONTRAST  TECHNIQUE: Contiguous axial images were obtained from the base of the skull through the vertex without intravenous contrast.  COMPARISON:  01/23/2012  FINDINGS: No intracranial hemorrhage, mass effect, or midline shift. No hydrocephalus. The basilar cisterns are patent. No evidence of territorial infarct. No intracranial fluid collection. Generalized atrophy is stable from prior. Calvarium is intact. Included paranasal sinuses and mastoid air  cells are well aerated.  IMPRESSION: Generalized atrophy, no acute intracranial process.   Electronically Signed   By: Jeb Levering M.D.   On: 04/22/2014 16:04    Scheduled Meds: . acidophilus  1 capsule Oral Daily  . calcium carbonate  1,250 mg Oral Daily  . cholecalciferol  2,000 Units Oral Daily  . docusate sodium  100 mg Oral QHS  . enoxaparin  100 mg Subcutaneous Q24H  . ferrous sulfate  325 mg Oral Q breakfast  . sertraline  50 mg Oral Daily  . sodium chloride  3 mL Intravenous Q12H    Continuous Infusions:    Time spent: 25 minutes  [facility] Pager [phone]. If 7PM-7AM, please contact night-coverage at [email or web address], password [facility] 04/23/2014, 4:59 PM  LOS: 1 day

## 2014-04-24 DIAGNOSIS — E43 Unspecified severe protein-calorie malnutrition: Secondary | ICD-10-CM | POA: Diagnosis present

## 2014-04-24 DIAGNOSIS — E785 Hyperlipidemia, unspecified: Secondary | ICD-10-CM

## 2014-04-24 DIAGNOSIS — Z7901 Long term (current) use of anticoagulants: Secondary | ICD-10-CM

## 2014-04-24 LAB — LIPID PANEL
Cholesterol: 183 mg/dL (ref 0–200)
HDL: 37 mg/dL — ABNORMAL LOW (ref >39)
LDL Cholesterol: 124 mg/dL — ABNORMAL HIGH (ref 0–99)
Total CHOL/HDL Ratio: 4.9 RATIO
Triglycerides: 111 mg/dL (ref <150)
VLDL: 22 mg/dL (ref 0–40)

## 2014-04-24 MED ORDER — ENSURE COMPLETE SHAKE PO LIQD: 1.0000 | Freq: Two times a day (BID) | ORAL | Status: AC

## 2014-04-24 MED ORDER — ATORVASTATIN CALCIUM 10 MG PO TABS
10.0000 mg | ORAL_TABLET | Freq: Every day | ORAL | Status: DC
  Administered 2014-04-24: 10 mg via ORAL
  Filled 2014-04-24: qty 1

## 2014-04-24 MED ORDER — ATORVASTATIN CALCIUM 10 MG PO TABS: 10.0000 mg | ORAL_TABLET | Freq: Every day | ORAL | Status: AC

## 2014-04-24 MED ORDER — ASPIRIN EC 81 MG PO TBEC: 81.0000 mg | DELAYED_RELEASE_TABLET | Freq: Every day | ORAL | Status: AC

## 2014-04-24 MED ORDER — ENSURE COMPLETE PO LIQD
237.0000 mL | Freq: Two times a day (BID) | ORAL | Status: DC
  Administered 2014-04-24: 237 mL via ORAL

## 2014-04-24 NOTE — Evaluation (Signed)
Physical Therapy Evaluation Patient Details Name: Taylor Alvarez MRN: [id number] DOB: Sep 19, 1931 Today's Date: 04/24/2014   History of Present Illness  This is a very pleasant 81 year old lady who has a history of metastatic, stage IV, pancreatic cancer receiving chemotherapy who  presented with an episode of left arm and left leg weakness which started at approximately 2 PM yesterday and lasted for probably about 1 hour. The strength in her left side is almost back to normal now.   Clinical Impression  Pt is no longer safe using her cane.  She will need a rolling walker for safety.  Pt will need supervision 24 hr. But is functioning at a level high enough not to need skilled.  The pt is working on help via family and friends.    Follow Up Recommendations Home health PT;Supervision/Assistance - 24 hour    Equipment Recommendations  3in1 (PT);Rolling walker with 5" wheels;Other (comment) (shower chair)    Recommendations for Other Services OT consult     Precautions / Restrictions Precautions Precautions: Fall Required Braces or Orthoses:  (Pt had an AFO for her Lt ankle but this was several years ago will benefit from an orthotic consult for a Lt  AFO) Restrictions Weight Bearing Restrictions: No      Mobility  Bed Mobility Overal bed mobility: Modified Independent                Transfers Overall transfer level: Needs assistance Equipment used: Rolling walker (2 wheeled) Transfers: Sit to/from Stand Sit to Stand: Supervision         General transfer comment: decreased balance upon standing; needs verbal cuing stand to sit or pt tends to "floop" down without controling descent.  Ambulation/Gait Ambulation/Gait assistance: Supervision Ambulation Distance (Feet): 200 Feet Assistive device: Rolling walker (2 wheeled)     Gait velocity interpretation: at or above normal speed for age/gender               Balance Overall balance assessment:  (unable to  Single leg stance on either LE )                                           Pertinent Vitals/Pain Pain Assessment: No/denies pain    Home Living Family/patient expects to be discharged to:: Unsure (Pt realizes she needs 24 hr would lke to see if she canfind family/hired help to stay with her with [facility] if not will need SNF) Living Arrangements: Alone Available Help at Discharge: Family;Friend(s);Available PRN/intermittently Type of Home: House Home Access: Stairs to enter Entrance Stairs-Rails: None Entrance Stairs-Number of Steps: 1 Home Layout: Two level;Able to live on main level with bedroom/bathroom Home Equipment: Cane - single point      Prior Function Level of Independence: Independent with assistive device(s)               Hand Dominance   Dominant Hand: Right    Extremity/Trunk Assessment               Lower Extremity Assessment: LLE deficits/detail;RLE deficits/detail RLE Deficits / Details: generally 3+/5 LLE Deficits / Details: Lt ankle DF 1+/5 needing an AFO for safety in ambulataion;  knee mm in 3+/5 hip is 2+ to 3-/5     Communication   Communication: No difficulties  Cognition Arousal/Alertness: Awake/alert Behavior During Therapy: WFL for tasks assessed/performed Overall Cognitive Status: Impaired/Different from baseline Area  of Impairment: Safety/judgement;Awareness   Current Attention Level: Focused     Safety/Judgement: Decreased awareness of safety     General Comments: Pt had some minor confusion with task of washing up and dressing self        Exercises General Exercises - Lower Extremity Ankle Circles/Pumps: Both;10 reps Heel Slides:  (bridge x 10) Hip ABduction/ADduction: Both;5 reps Straight Leg Raises: Both;5 reps      Assessment/Plan    PT Assessment Patient needs continued PT services  PT Diagnosis Difficulty walking;Generalized weakness   PT Problem List Decreased strength;Decreased activity  tolerance;Decreased balance;Decreased safety awareness  PT Treatment Interventions Gait training;Stair training;Balance training;Therapeutic exercise;Therapeutic activities   PT Goals (Current goals can be found in the Care Plan section)      Frequency Min 3X/week   Barriers to discharge           End of Session Equipment Utilized During Treatment: Gait belt   Patient left: with call bell/phone within reach;in bed      Functional Limitation: Mobility: Walking and moving around Mobility: Walking and Moving Around Current Status ([id number]): At least 1 percent but less than 20 percent impaired, limited or restricted Mobility: Walking and Moving Around Goal Status [phone]): At least 1 percent but less than 20 percent impaired, limited or restricted Mobility: Walking and Moving Around Discharge Status [phone]): At least 1 percent but less than 20 percent impaired, limited or restricted    Time: 1130-1111 PT Time Calculation (min) (ACUTE ONLY): 1421 min   Charges:   PT Evaluation $Initial PT Evaluation Tier I: 1 Procedure PT Treatments $Gait Training: 8-22 mins   PT G Codes:   PT G-Codes **NOT FOR INPATIENT CLASS** Functional Limitation: Mobility: Walking and moving around Mobility: Walking and Moving Around Current Status ([id number]): At least 1 percent but less than 20 percent impaired, limited or restricted Mobility: Walking and Moving Around Goal Status [phone]): At least 1 percent but less than 20 percent impaired, limited or restricted Mobility: Walking and Moving Around Discharge Status [phone]): At least 1 percent but less than 20 percent impaired, limited or restricted    RUSSELL,CINDY 04/24/2014, 11:16 AM

## 2014-04-24 NOTE — Discharge Summary (Addendum)
Discharge Summary  Taylor Alvarez FGH:[id number] DOB: April 22, 1932  PCP: Glenda Chroman., MD  Admit date: 04/22/2014 Discharge date: 04/24/2014  Time spent: 35 minutes  Recommendations for Outpatient Follow-up:  1. New medication: Lipitor 10 mg by mouth daily at bedtime  2. Patient will be discharged with home health and the following equipment: Shower chair, rolling walker, 3 in 1, left AFO boot. 3. New medication: Aspirin 81 mg by mouth daily 4. Added and sure complete supplement by mouth twice a day  Discharge Diagnoses:  Active Hospital Problems   Diagnosis Date Noted  . TIA (transient ischemic attack) 04/22/2014  . Chronic anticoagulation 04/24/2014  . Left-sided weakness 04/23/2014  . Deep vein thrombosis (DVT) 03/12/2014  . Cancer of pancreas, tail 02/04/2014  . Hypertension 03/05/2011  Hyperlipidemia   Resolved Hospital Problems   Diagnosis Date Noted Date Resolved  No resolved problems to display.    Discharge Condition: Improved, being discharged home  Diet recommendation: As tolerated  Filed Weights   04/22/14 1917 04/24/14 0641  Weight: 63.186 kg (139 lb 4.8 oz) 57.9 kg (127 lb 10.3 oz)    History of present illness:  81 year old female with past mental history of pancreatic cancer and DVT on chronic anticoagulation recently who was admitted on 12/31 for sudden onset feeling like her hands were in knots and she could not use them except for clumsily. Initial CT scan head was unrevealing. Patient brought in for TIA workup.  Hospital Course:  Principal Problem:   TIA (transient ischemic attack): By hospital day 2, symptoms had resolved. Unable to get MRI because patient has history in the past year of a metal bladder implant done at [facility]. Carotid Dopplers unrevealing. Fasting lipid panel noted LDL of 124, so patient started on Lipitor. Patient already on Lovenox which was started last month for DVT.  Because she's been able tolerate this, no major bleeding. We'll  go ahead and start low-dose aspirin 81 mg. Unable to get echocardiogram due to scheduling for holiday weekend. That said, even if echocardiogram positive for thrombus, patient already on anticoagulation. Active Problems:   Hypertension   Cancer of pancreas, tail: Stage IV. Being managed by oncology.    Deep vein thrombosis (DVT): On chronic anticoagulation.    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 She has had much less ambulation in the past month or so due to her cancer diagnosis. Evaluated by physical therapy who recommended home health only if patient can have 24 hour assistance. Patient has no family, but states that she has friends and would like to see if she can make arrangements with them. Setting up home health and adding equipment as dictated above.   hyperlipidemia: As above, started on Lipitor  Severe protein calorie malnutrition:Pt meets criteria for severe MALNUTRITION in the context of chronic illness as evidenced by <75% estimated energy intake x 1 month, 11.1% wt loss x 1 month.  Procedures:  Carotid Dopplers done 1/1: No evidence of significant stenoses on either side  Consultations:  Neurology services not available on holiday weekend. Case discussed with neurology on-call at [facility]  Discharge Exam: BP 141/57 mmHg  Pulse 91  Temp(Src) 98.6 F (37 C) (Oral)  Resp 18  Ht 5\' 3"  (1.6 m)  Wt 57.9 kg (127 lb 10.3 oz)  BMI 22.62 kg/m2  SpO2 97%  General: Alert and oriented 3, no acute distress Cardiovascular: Regular rate and rhythm, S1-S2 Respiratory: Clear to auscultation bilaterally  Discharge Instructions You were cared  for by a hospitalist during your hospital stay. If you have any questions about your discharge medications or the care you received while you were in the hospital after you are discharged, you can call the unit and asked to speak with the hospitalist on call if the hospitalist that took care of you is  not available. Once you are discharged, your primary care physician will handle any further medical issues. Please note that NO REFILLS for any discharge medications will be authorized once you are discharged, as it is imperative that you return to your primary care physician (or establish a relationship with a primary care physician if you do not have one) for your aftercare needs so that they can reassess your need for medications and monitor your lab values.     Medication List    TAKE these medications        acetaminophen 500 MG tablet  Commonly known as:  TYLENOL  Take 500 mg by mouth every 6 (six) hours as needed. For pain     aspirin EC 81 MG tablet  Take 1 tablet (81 mg total) by mouth daily.     atorvastatin 10 MG tablet  Commonly known as:  LIPITOR  Take 1 tablet (10 mg total) by mouth daily at 6 PM.     calcium carbonate 600 MG Tabs tablet  Commonly known as:  OS-CAL  Take 600 mg by mouth daily.     docusate sodium 100 MG capsule  Commonly known as:  COLACE  Take 100 mg by mouth at bedtime.     enoxaparin 100 MG/ML injection  Commonly known as:  LOVENOX  Inject 1 mL (100 mg total) into the skin daily.     ENSURE COMPLETE SHAKE Liqd  Take 1 Bottle by mouth 2 (two) times daily.     ferrous sulfate 325 (65 FE) MG tablet  Take 325 mg by mouth daily with breakfast.     FLORAJEN ACIDOPHILUS Caps  Take 1 capsule by mouth daily.     HYDROcodone-acetaminophen 5-325 MG per tablet  Commonly known as:  NORCO/VICODIN  Take 1 tablet by mouth every 6 (six) hours as needed for moderate pain.     lidocaine-prilocaine cream  Commonly known as:  EMLA  Apply 1 application topically as needed. Apply to [facility] site 1-2 hours prior to stick and cover with plastic wrap     MAGNESIUM CITRATE PO  Take 400 mg by mouth daily as needed (for constipation).     nitrofurantoin 100 MG capsule  Commonly known as:  MACRODANTIN  Take 100 mg by mouth every morning.     prochlorperazine 5  MG tablet  Commonly known as:  COMPAZINE  Take 1-2 tablets (5-10 mg total) by mouth every 6 (six) hours as needed for nausea or vomiting.     sertraline 50 MG tablet  Commonly known as:  ZOLOFT  Take 50 mg by mouth daily.     Vitamin D 2000 UNITS Caps  Take 2,000 Units by mouth daily.     WOMENS 50+ MULTI VITAMIN/MIN PO  Take 1 tablet by mouth daily.          Allergies  Allergen Reactions  . Cephalexin Diarrhea and Nausea And Vomiting  . Clindamycin/Lincomycin Nausea Only  . Nsaids Other (See Comments)    Bleeding of the colon.  [place] [Diltiazem Hcl] Rash    On face      The results of significant diagnostics from this hospitalization (including imaging,  microbiology, ancillary and laboratory) are listed below for reference.    Significant Diagnostic Studies: Ct Head Wo Contrast  04/22/2014   CLINICAL DATA:  Left arm and leg weakness.  EXAM: CT HEAD WITHOUT CONTRAST  TECHNIQUE: Contiguous axial images were obtained from the base of the skull through the vertex without intravenous contrast.  COMPARISON:  01/23/2012  FINDINGS: No intracranial hemorrhage, mass effect, or midline shift. No hydrocephalus. The basilar cisterns are patent. No evidence of territorial infarct. No intracranial fluid collection. Generalized atrophy is stable from prior. Calvarium is intact. Included paranasal sinuses and mastoid air cells are well aerated.  IMPRESSION: Generalized atrophy, no acute intracranial process.   Electronically Signed   By: Jeb Levering M.D.   On: 04/22/2014 16:04   Ct Abdomen Pelvis W Contrast  04/08/2014   CLINICAL DATA:  Left-sided abdominal pain with constipation. Pancreatic cancer diagnosed 3 months ago. Chemotherapy in progress. Subsequent ectatic.  EXAM: CT ABDOMEN AND PELVIS WITH CONTRAST  TECHNIQUE: Multidetector CT imaging of the abdomen and pelvis was performed using the standard protocol following bolus administration of intravenous contrast.  CONTRAST:   185mL OMNIPAQUE IOHEXOL 300 MG/ML  SOLN  COMPARISON:  Prior study 12/23/2013.  FINDINGS: Lower chest: Clear lung bases. No significant pleural or pericardial effusion.  Hepatobiliary: There is progressive multifocal hepatic metastatic disease. A previously referenced lesions centrally in the right hepatic lobe measures 2.6 x 2.2 cm on image 19 of series 2 (previously 1.4 x 1.2 cm). There are innumerable new lesions throughout all segments of the liver. No evidence of gallstones, gallbladder wall thickening or biliary dilatation.  Pancreas: The complex mass involving the pancreatic tail is difficult to accurately measure given its irregular shape, but is similar to the prior study. This measures approximately 4.8 x 3.9 cm on image 17 and demonstrates central low-density and calcification. Again, this mass likely invades the splenic hilum. The lesion abuts the splenic flexure of the colon and may invade it. The pancreatic body and tail appear normal.  Spleen: The pancreatic tail mass may invade the spleen. There is low-density anteriorly in the spleen.  Adrenals/Urinary Tract: There is stable minimal nonspecific nodularity of the left adrenal gland. The right adrenal gland appears normal.The kidneys appear normal without evidence of urinary tract calculus or hydronephrosis. No bladder abnormalities are seen.  Stomach/Bowel: The stomach is decompressed. No bowel wall thickening, distention or surrounding inflammatory change demonstrated. As noted above, the mass within the pancreatic tail may involve the splenic flexure of the colon. There is no extravasated enteric contrast. There is no ascites.However, there is new nodularity in the pelvis, including a 2.4 cm component on image 59 which demonstrates mildly thickened walls. There is no generalized peritoneal nodularity.  Vascular/Lymphatic: There are no enlarged abdominal or pelvic lymph nodes. As above, there is left pelvic nodularity suspicious for possible  peritoneal carcinomatosis. There is stable mild atherosclerosis of the aorta and iliac arteries. The portal and superior mesenteric veins are patent. The splenic vein is probably occluded peripherally at the level of the pancreatic mass. There is nonocclusive DVT within the right common femoral vein (images 68 through 78). No pelvic DVT demonstrated.  Reproductive: Status post hysterectomy.  Other: No evidence of abdominal wall mass or hernia.  Musculoskeletal: No acute or significant osseous findings. There is no evidence of osseous metastatic disease. Postsurgical changes are present status post lumbar laminectomy and neurostimulator placement. The low-density left paraspinal lesion in the lower thoracic region measuring 2.4 cm on image number 3  is unchanged.  IMPRESSION: 1. Progressive multifocal hepatic metastatic disease. 2. New nodularity in the pelvis suspicious for peritoneal carcinomatosis. There is no ascites or generalized peritoneal nodularity. 3. The complex mass involving the pancreatic tail has not grossly changed. Again, this may invade the spleen and splenic flexure of the colon. The splenic vein is likely occluded. 4. Nonocclusive right femoral DVT. 5. These results will be called to the ordering clinician or representative by the Radiologist Assistant, and communication documented in the PACS or zVision Dashboard.   Electronically Signed   By: Camie Patience M.D.   On: 04/08/2014 16:09   US Carotid Bilateral  04/23/2014   CLINICAL DATA:  TIA, left arm and leg weakness, hypertension and history of pancreatic carcinoma.  EXAM: BILATERAL CAROTID DUPLEX ULTRASOUND  TECHNIQUE: Pearline Cables scale imaging, color Doppler and duplex ultrasound were performed of bilateral carotid and vertebral arteries in the neck.  COMPARISON:  07/29/2007  FINDINGS: Criteria: Quantification of carotid stenosis is based on velocity parameters that correlate the residual internal carotid diameter with NASCET-based stenosis levels,  using the diameter of the distal internal carotid lumen as the denominator for stenosis measurement.  The following velocity measurements were obtained:  RIGHT  ICA:  87/28 cm/sec  CCA:  27/03 cm/sec  SYSTOLIC ICA/CCA RATIO:  1.2  DIASTOLIC ICA/CCA RATIO:  2.1  ECA:  75 cm/sec  LEFT  ICA:  96/24 cm/sec  CCA:  50/09 cm/sec  SYSTOLIC ICA/CCA RATIO:  1.1  DIASTOLIC ICA/CCA RATIO:  2.0  ECA:  92 cm/sec  RIGHT CAROTID ARTERY: The common carotid artery shows intimal thickening. Mild amount of calcified plaque is seen at the origin of the external carotid artery. No plaque is identified in the internal carotid artery. There is no evidence of ICA stenosis with normal velocities and waveforms obtained.  RIGHT VERTEBRAL ARTERY: Antegrade flow with normal waveform and velocity.  LEFT CAROTID ARTERY: Intimal thickening present in the common carotid artery. Mild amount of calcified plaque is present at the origin of the external carotid artery. The internal carotid artery is normal in appearance without evidence of plaque or elevated velocities. No evidence of ICA stenosis.  LEFT VERTEBRAL ARTERY: Antegrade flow with normal waveform and velocity.  IMPRESSION: No evidence common carotid or internal carotid artery stenosis in the neck. Mild calcified plaque is seen at the origins of the external carotid arteries bilaterally.   Electronically Signed   By: Aletta Edouard M.D.   On: 04/23/2014 10:37    Microbiology: No results found for this or any previous visit (from the past 240 hour(s)).   Labs: Basic Metabolic Panel:  Recent Labs Lab 04/22/14 1539 04/22/14 1551 04/23/14 0635  NA 139 140 140  K 3.5 3.6 4.3  CL 104 102 106  CO2 29  --  28  GLUCOSE 103* 103* 113*  BUN 16 15 15   CREATININE 0.71 0.70 0.61  CALCIUM 8.7  --  8.8   Liver Function Tests:  Recent Labs Lab 04/22/14 1539 04/23/14 0635  AST 63* 58*  ALT 43* 40*  ALKPHOS 269* 250*  BILITOT 0.6 0.6  PROT 6.3 5.7*  ALBUMIN 3.1* 2.7*   No  results for input(s): LIPASE, AMYLASE in the last 168 hours. No results for input(s): AMMONIA in the last 168 hours. CBC:  Recent Labs Lab 04/22/14 1539 04/22/14 1551 04/23/14 0635  WBC 9.8  --  11.2*  NEUTROABS 5.8  --   --   HGB 11.3* 13.6 10.7*  HCT 36.3 40.0 34.3*  MCV 84.8  --  84.3  PLT 294  --  263   Cardiac Enzymes: No results for input(s): CKTOTAL, CKMB, CKMBINDEX, TROPONINI in the last 168 hours. BNP: BNP (last 3 results) No results for input(s): PROBNP in the last 8760 hours. CBG: No results for input(s): GLUCAP in the last 168 hours.     Signed:  Annita Brod  [facility] 04/24/2014, 3:43 PM

## 2014-04-24 NOTE — Progress Notes (Signed)
INITIAL NUTRITION ASSESSMENT  DOCUMENTATION CODES Per approved criteria  -Severe malnutrition in the context of chronic illness   Pt meets criteria for severe MALNUTRITION in the context of chronic illness as evidenced by <75% estimated energy intake x 1 month, 11.1% wt loss x 1 month.  INTERVENTION: Ensure Complete po BID, each supplement provides 350 kcal and 13 grams of protein  NUTRITION DIAGNOSIS: Increased nutrient needs related to cancer dx, on chemotherapy as evidenced by estimated needs, 11.1% wt loss x 1 month.   Goal: Pt will meet >90% of estimated nutritional needs  Monitor:  PO/supplement intake, labs, weight changes, I/O's  Reason for Assessment: MST=2, consult for assess needs  81 y.o. female  Admitting Dx: TIA (transient ischemic attack)  81 year old female with past mental history of pancreatic cancer and DVT on chronic anticoagulation recently who was admitted on 12/31 for sudden onset feeling like her hands were in knots and she could not use them except for clumsily. Initial CT scan head was unrevealing. Patient brought in for TIA workup  ASSESSMENT: Pt admitted with TIA. She also has a hx of stage IV pancreatic cancer, currently receiving chemotherapy.  Appetite is currently good; PO: 100%.  However, pt has experienced weight loss, likely due to chemotherapy treatments. Noted a 16# (11.1%) wt loss x 1 month. UBW around 150#. Will add nutritional supplement due to increased needs due to cancer treatment and recent weight loss.  Noted discharge orders for home. Nutrition-focused physical exam deferred at this time.  Labs reviewed. Glucose: 113.   Height: Ht Readings from Last 1 Encounters:  04/22/14 5\' 3"  (1.6 m)    Weight: Wt Readings from Last 1 Encounters:  04/24/14 127 lb 10.3 oz (57.9 kg)    Ideal Body Weight: 115#  % Ideal Body Weight: 110%  Wt Readings from Last 25 Encounters:  04/24/14 127 lb 10.3 oz (57.9 kg)  04/21/14 139 lb 3.2 oz  (63.141 kg)  04/09/14 141 lb 8 oz (64.184 kg)  03/26/14 143 lb 14.4 oz (65.273 kg)  03/12/14 145 lb 9.6 oz (66.044 kg)  02/26/14 146 lb 8 oz (66.452 kg)  02/26/14 204 lb 11.2 oz (92.851 kg)  02/08/14 150 lb 3.2 oz (68.13 kg)  02/04/14 150 lb 3.2 oz (68.13 kg)  12/03/13 147 lb 12.8 oz (67.042 kg)  07/13/13 152 lb 11.2 oz (69.264 kg)  01/13/13 152 lb (68.947 kg)  09/03/12 149 lb (67.586 kg)  04/03/12 150 lb 12.8 oz (68.402 kg)  11/27/11 144 lb 6.4 oz (65.499 kg)  06/06/11 150 lb (68.04 kg)  03/05/11 153 lb (69.4 kg)  01/25/11 149 lb 12.8 oz (67.949 kg)  ]  Usual Body Weight: 150#  % Usual Body Weight: 85%  BMI:  Body mass index is 22.62 kg/(m^2). Normal weight range  Estimated Nutritional Needs: Kcal: 1700-1900 Protein: 70-80 grams Fluid: 1.7-1.9 L  Skin: WDL  Diet Order: Diet regular  EDUCATION NEEDS: -Education not appropriate at this time   Intake/Output Summary (Last 24 hours) at 04/24/14 1304 Last data filed at 04/24/14 0900  Gross per 24 hour  Intake    480 ml  Output      0 ml  Net    480 ml    Last BM: 04/22/14  Labs:   Recent Labs Lab 04/22/14 1539 04/22/14 1551 04/23/14 0635  NA 139 140 140  K 3.5 3.6 4.3  CL 104 102 106  CO2 29  --  28  BUN 16 15 15   CREATININE 0.71  0.70 0.61  CALCIUM 8.7  --  8.8  GLUCOSE 103* 103* 113*    CBG (last 3)  No results for input(s): GLUCAP in the last 72 hours.  Scheduled Meds: . acidophilus  1 capsule Oral Daily  . atorvastatin  10 mg Oral q1800  . calcium carbonate  1,250 mg Oral Daily  . cholecalciferol  2,000 Units Oral Daily  . docusate sodium  100 mg Oral QHS  . enoxaparin  100 mg Subcutaneous Q24H  . ferrous sulfate  325 mg Oral Q breakfast  . sertraline  50 mg Oral Daily  . sodium chloride  3 mL Intravenous Q12H    Continuous Infusions:   Past Medical History  Diagnosis Date  . Celiac disease   . GERD (gastroesophageal reflux disease)   . Hypertension   . Anxiety   . History of fall  2014    at [facility] store  . Deep vein thrombosis (DVT) 03/12/2014  . Cancer 12/2013    pancreatic ca  . Pancreatic cancer     Past Surgical History  Procedure Laterality Date  . Urge incontinence    . Hypertension    . Back surgery    . Partial hysterectomy with bladder tac    . Bilateral cataract surgery    . Ovarian cyst removal    . Tubal ligation    . Cesarean section    . Colonoscopy  06/06/2011    Procedure: COLONOSCOPY;  Surgeon: Rogene Houston, MD;  Location: [facility];  Service: Endoscopy;  Laterality: N/A;  . Interstim implant placement  04/30/13,05/12/13  . Wrist sprain Left   . Colonoscopy N/A 12/16/2013    Procedure: COLONOSCOPY;  Surgeon: Rogene Houston, MD;  Location: [facility];  Service: Endoscopy;  Laterality: N/A;  730-rescheduled 8/26 @ 100 Ann notified pt    Dietrick Barris A. Jimmye Norman, RD, LDN, CDE Pager: [phone]

## 2014-04-24 NOTE — Progress Notes (Signed)
61 Assisted pt with wash up, gown & full bed linen change. Pt's bed noted saturated with urine. Pt did well washing/drying herself while sitting but had a harder time standing w/o assistance. Pt assisted to chair & slight delay noted in pt's ability to move her legs bilat & she c/o slight dizziness. Pt requires assistance with ADL's & ambulating at this time. No c/o pain or discomfort noted. Able to voice needs & concerns.

## 2014-04-24 NOTE — Discharge Summary (Deleted)
Discharge Summary  Taylor Alvarez VPX:[id number] DOB: 11/13/1931  PCP: Glenda Chroman., MD  Admit date: 04/22/2014 Discharge date: 04/24/2014  Time spent: 35 minutes  Recommendations for Outpatient Follow-up:  1. New medication: Lipitor 10 mg by mouth daily at bedtime  2. Patient will be discharged with home health and the following equipment: Shower chair, rolling walker, 3 in 1, left AFO boot. 3. New medication: Aspirin 81 mg by mouth daily  Discharge Diagnoses:  Active Hospital Problems   Diagnosis Date Noted  . TIA (transient ischemic attack) 04/22/2014  . Chronic anticoagulation 04/24/2014  . Left-sided weakness 04/23/2014  . Deep vein thrombosis (DVT) 03/12/2014  . Cancer of pancreas, tail 02/04/2014  . Hypertension 03/05/2011  Hyperlipidemia   Resolved Hospital Problems   Diagnosis Date Noted Date Resolved  No resolved problems to display.    Discharge Condition: Improved, being discharged home  Diet recommendation: As tolerated  Filed Weights   04/22/14 1917 04/24/14 0641  Weight: 63.186 kg (139 lb 4.8 oz) 57.9 kg (127 lb 10.3 oz)    History of present illness:  81 year old female with past mental history of pancreatic cancer and DVT on chronic anticoagulation recently who was admitted on 12/31 for sudden onset feeling like her hands were in knots and she could not use them except for clumsily. Initial CT scan head was unrevealing. Patient brought in for TIA workup.  Hospital Course:  Principal Problem:   TIA (transient ischemic attack): By hospital day 2, symptoms had resolved. Unable to get MRI because patient has history in the past year of a metal bladder implant done at [facility]. Carotid Dopplers unrevealing. Fasting lipid panel noted LDL of 124, so patient started on Lipitor. Patient already on Lovenox which was started last month for DVT.  Because she's been able tolerate this, no major bleeding. We'll go ahead and start low-dose aspirin 81 mg. Unable to get  echocardiogram due to scheduling for holiday weekend. That said, even if echocardiogram positive for thrombus, patient already on anticoagulation. Active Problems:   Hypertension   Cancer of pancreas, tail: Stage IV. Being managed by oncology.    Deep vein thrombosis (DVT): On chronic anticoagulation.    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 She has had much less ambulation in the past month or so due to her cancer diagnosis. Evaluated by physical therapy who recommended home health only if patient can have 24 hour assistance. Patient has no family, but states that she has friends and would like to see if she can make arrangements with them. Setting up home health and adding equipment as dictated above.   hyperlipidemia: As above, started on Lipitor  Procedures:  Carotid Dopplers done 1/1: No evidence of significant stenoses on either side  Consultations:  Neurology services not available on holiday weekend. Case discussed with neurology on-call at [facility]  Discharge Exam: BP 142/54 mmHg  Pulse 71  Temp(Src) 98.4 F (36.9 C) (Oral)  Resp 18  Ht 5\' 3"  (1.6 m)  Wt 57.9 kg (127 lb 10.3 oz)  BMI 22.62 kg/m2  SpO2 93%  General: Alert and oriented 3, no acute distress Cardiovascular: Regular rate and rhythm, S1-S2 Respiratory: Clear to auscultation bilaterally  Discharge Instructions You were cared for by a hospitalist during your hospital stay. If you have any questions about your discharge medications or the care you received while you were in the hospital after you are discharged, you can call the unit and asked to speak with the  hospitalist on call if the hospitalist that took care of you is not available. Once you are discharged, your primary care physician will handle any further medical issues. Please note that NO REFILLS for any discharge medications will be authorized once you are discharged, as it is imperative that you return to  your primary care physician (or establish a relationship with a primary care physician if you do not have one) for your aftercare needs so that they can reassess your need for medications and monitor your lab values.     Medication List    TAKE these medications        aspirin EC 81 MG tablet  Take 1 tablet (81 mg total) by mouth daily.      ASK your doctor about these medications        acetaminophen 500 MG tablet  Commonly known as:  TYLENOL  Take 500 mg by mouth every 6 (six) hours as needed. For pain     calcium carbonate 600 MG Tabs tablet  Commonly known as:  OS-CAL  Take 600 mg by mouth daily.     docusate sodium 100 MG capsule  Commonly known as:  COLACE  Take 100 mg by mouth at bedtime.     enoxaparin 100 MG/ML injection  Commonly known as:  LOVENOX  Inject 1 mL (100 mg total) into the skin daily.     ferrous sulfate 325 (65 FE) MG tablet  Take 325 mg by mouth daily with breakfast.     FLORAJEN ACIDOPHILUS Caps  Take 1 capsule by mouth daily.     HYDROcodone-acetaminophen 5-325 MG per tablet  Commonly known as:  NORCO/VICODIN  Take 1 tablet by mouth every 6 (six) hours as needed for moderate pain.     lidocaine-prilocaine cream  Commonly known as:  EMLA  Apply 1 application topically as needed. Apply to [facility] site 1-2 hours prior to stick and cover with plastic wrap     MAGNESIUM CITRATE PO  Take 400 mg by mouth daily as needed (for constipation).     nitrofurantoin 100 MG capsule  Commonly known as:  MACRODANTIN  Take 100 mg by mouth every morning.     prochlorperazine 5 MG tablet  Commonly known as:  COMPAZINE  Take 1-2 tablets (5-10 mg total) by mouth every 6 (six) hours as needed for nausea or vomiting.     sertraline 50 MG tablet  Commonly known as:  ZOLOFT  Take 50 mg by mouth daily.     Vitamin D 2000 UNITS Caps  Take 2,000 Units by mouth daily.     WOMENS 50+ MULTI VITAMIN/MIN PO  Take 1 tablet by mouth daily.        Allergies    Allergen Reactions  . Cephalexin Diarrhea and Nausea And Vomiting  . Clindamycin/Lincomycin Nausea Only  . Nsaids Other (See Comments)    Bleeding of the colon.  [place] [Diltiazem Hcl] Rash    On face      The results of significant diagnostics from this hospitalization (including imaging, microbiology, ancillary and laboratory) are listed below for reference.    Significant Diagnostic Studies: Ct Head Wo Contrast  04/22/2014   CLINICAL DATA:  Left arm and leg weakness.  EXAM: CT HEAD WITHOUT CONTRAST  TECHNIQUE: Contiguous axial images were obtained from the base of the skull through the vertex without intravenous contrast.  COMPARISON:  01/23/2012  FINDINGS: No intracranial hemorrhage, mass effect, or midline shift. No hydrocephalus. The basilar cisterns  are patent. No evidence of territorial infarct. No intracranial fluid collection. Generalized atrophy is stable from prior. Calvarium is intact. Included paranasal sinuses and mastoid air cells are well aerated.  IMPRESSION: Generalized atrophy, no acute intracranial process.   Electronically Signed   By: Jeb Levering M.D.   On: 04/22/2014 16:04   Ct Abdomen Pelvis W Contrast  04/08/2014   CLINICAL DATA:  Left-sided abdominal pain with constipation. Pancreatic cancer diagnosed 3 months ago. Chemotherapy in progress. Subsequent ectatic.  EXAM: CT ABDOMEN AND PELVIS WITH CONTRAST  TECHNIQUE: Multidetector CT imaging of the abdomen and pelvis was performed using the standard protocol following bolus administration of intravenous contrast.  CONTRAST:  148mL OMNIPAQUE IOHEXOL 300 MG/ML  SOLN  COMPARISON:  Prior study 12/23/2013.  FINDINGS: Lower chest: Clear lung bases. No significant pleural or pericardial effusion.  Hepatobiliary: There is progressive multifocal hepatic metastatic disease. A previously referenced lesions centrally in the right hepatic lobe measures 2.6 x 2.2 cm on image 19 of series 2 (previously 1.4 x 1.2 cm). There are  innumerable new lesions throughout all segments of the liver. No evidence of gallstones, gallbladder wall thickening or biliary dilatation.  Pancreas: The complex mass involving the pancreatic tail is difficult to accurately measure given its irregular shape, but is similar to the prior study. This measures approximately 4.8 x 3.9 cm on image 17 and demonstrates central low-density and calcification. Again, this mass likely invades the splenic hilum. The lesion abuts the splenic flexure of the colon and may invade it. The pancreatic body and tail appear normal.  Spleen: The pancreatic tail mass may invade the spleen. There is low-density anteriorly in the spleen.  Adrenals/Urinary Tract: There is stable minimal nonspecific nodularity of the left adrenal gland. The right adrenal gland appears normal.The kidneys appear normal without evidence of urinary tract calculus or hydronephrosis. No bladder abnormalities are seen.  Stomach/Bowel: The stomach is decompressed. No bowel wall thickening, distention or surrounding inflammatory change demonstrated. As noted above, the mass within the pancreatic tail may involve the splenic flexure of the colon. There is no extravasated enteric contrast. There is no ascites.However, there is new nodularity in the pelvis, including a 2.4 cm component on image 59 which demonstrates mildly thickened walls. There is no generalized peritoneal nodularity.  Vascular/Lymphatic: There are no enlarged abdominal or pelvic lymph nodes. As above, there is left pelvic nodularity suspicious for possible peritoneal carcinomatosis. There is stable mild atherosclerosis of the aorta and iliac arteries. The portal and superior mesenteric veins are patent. The splenic vein is probably occluded peripherally at the level of the pancreatic mass. There is nonocclusive DVT within the right common femoral vein (images 68 through 78). No pelvic DVT demonstrated.  Reproductive: Status post hysterectomy.  Other:  No evidence of abdominal wall mass or hernia.  Musculoskeletal: No acute or significant osseous findings. There is no evidence of osseous metastatic disease. Postsurgical changes are present status post lumbar laminectomy and neurostimulator placement. The low-density left paraspinal lesion in the lower thoracic region measuring 2.4 cm on image number 3 is unchanged.  IMPRESSION: 1. Progressive multifocal hepatic metastatic disease. 2. New nodularity in the pelvis suspicious for peritoneal carcinomatosis. There is no ascites or generalized peritoneal nodularity. 3. The complex mass involving the pancreatic tail has not grossly changed. Again, this may invade the spleen and splenic flexure of the colon. The splenic vein is likely occluded. 4. Nonocclusive right femoral DVT. 5. These results will be called to the ordering clinician or representative  by the Radiologist Assistant, and communication documented in the PACS or zVision Dashboard.   Electronically Signed   By: Camie Patience M.D.   On: 04/08/2014 16:09   US Carotid Bilateral  04/23/2014   CLINICAL DATA:  TIA, left arm and leg weakness, hypertension and history of pancreatic carcinoma.  EXAM: BILATERAL CAROTID DUPLEX ULTRASOUND  TECHNIQUE: Pearline Cables scale imaging, color Doppler and duplex ultrasound were performed of bilateral carotid and vertebral arteries in the neck.  COMPARISON:  07/29/2007  FINDINGS: Criteria: Quantification of carotid stenosis is based on velocity parameters that correlate the residual internal carotid diameter with NASCET-based stenosis levels, using the diameter of the distal internal carotid lumen as the denominator for stenosis measurement.  The following velocity measurements were obtained:  RIGHT  ICA:  87/28 cm/sec  CCA:  44/81 cm/sec  SYSTOLIC ICA/CCA RATIO:  1.2  DIASTOLIC ICA/CCA RATIO:  2.1  ECA:  75 cm/sec  LEFT  ICA:  96/24 cm/sec  CCA:  85/63 cm/sec  SYSTOLIC ICA/CCA RATIO:  1.1  DIASTOLIC ICA/CCA RATIO:  2.0  ECA:  92 cm/sec   RIGHT CAROTID ARTERY: The common carotid artery shows intimal thickening. Mild amount of calcified plaque is seen at the origin of the external carotid artery. No plaque is identified in the internal carotid artery. There is no evidence of ICA stenosis with normal velocities and waveforms obtained.  RIGHT VERTEBRAL ARTERY: Antegrade flow with normal waveform and velocity.  LEFT CAROTID ARTERY: Intimal thickening present in the common carotid artery. Mild amount of calcified plaque is present at the origin of the external carotid artery. The internal carotid artery is normal in appearance without evidence of plaque or elevated velocities. No evidence of ICA stenosis.  LEFT VERTEBRAL ARTERY: Antegrade flow with normal waveform and velocity.  IMPRESSION: No evidence common carotid or internal carotid artery stenosis in the neck. Mild calcified plaque is seen at the origins of the external carotid arteries bilaterally.   Electronically Signed   By: Aletta Edouard M.D.   On: 04/23/2014 10:37    Microbiology: No results found for this or any previous visit (from the past 240 hour(s)).   Labs: Basic Metabolic Panel:  Recent Labs Lab 04/22/14 1539 04/22/14 1551 04/23/14 0635  NA 139 140 140  K 3.5 3.6 4.3  CL 104 102 106  CO2 29  --  28  GLUCOSE 103* 103* 113*  BUN 16 15 15   CREATININE 0.71 0.70 0.61  CALCIUM 8.7  --  8.8   Liver Function Tests:  Recent Labs Lab 04/22/14 1539 04/23/14 0635  AST 63* 58*  ALT 43* 40*  ALKPHOS 269* 250*  BILITOT 0.6 0.6  PROT 6.3 5.7*  ALBUMIN 3.1* 2.7*   No results for input(s): LIPASE, AMYLASE in the last 168 hours. No results for input(s): AMMONIA in the last 168 hours. CBC:  Recent Labs Lab 04/22/14 1539 04/22/14 1551 04/23/14 0635  WBC 9.8  --  11.2*  NEUTROABS 5.8  --   --   HGB 11.3* 13.6 10.7*  HCT 36.3 40.0 34.3*  MCV 84.8  --  84.3  PLT 294  --  263   Cardiac Enzymes: No results for input(s): CKTOTAL, CKMB, CKMBINDEX, TROPONINI in  the last 168 hours. BNP: BNP (last 3 results) No results for input(s): PROBNP in the last 8760 hours. CBG: No results for input(s): GLUCAP in the last 168 hours.     Signed:  Annita Brod  [facility] 04/24/2014, 12:14 PM

## 2014-04-24 NOTE — Progress Notes (Signed)
60 Pt d/c home with family member here to drive her home to [place]. D/C papers & instructions given to pt & family member. [place] notified of pt's orders for home health, shower stool & rolling walker. Orders faxed to [facility] they called & confirmed they received the orders & would be notifying the pt via telephone at her home tomorrow. Pt & family member made aware. IV catheter removed from pt's RIGHT arm, catheter tip intact, no s/s of infection noted, pt tolerated well. Pt dressed & confirmed she had her purse, cell phone, partial denture & glasses with her to take home. Staff assisted pt to vehicle via w/c.

## 2014-04-25 LAB — HEMOGLOBIN A1C
HEMOGLOBIN A1C: 5.5 % (ref <5.7)
Mean Plasma Glucose: 111 mg/dL (ref <117)

## 2014-04-25 NOTE — Progress Notes (Deleted)
[facility] CONSULT NOTE  Patient Care Team: Glenda Chroman, MD as PCP - General (Internal Medicine) Ladell Pier, MD as Consulting Physician (Oncology) Renda Rolls, MD as Referring Physician (Internal Medicine)  CHIEF COMPLAINTS/PURPOSE OF CONSULTATION:  Pancreatic Cancer, tail of pancreas, stage IV, with invasion into the colon. Colonoscopy/biopsy 12/16/2013 DVT CVA with residual left sided weakness Severe protein calorie malnutrition  Gemzar/Abraxane C1D1 on 02/12/2014  HISTORY OF PRESENTING ILLNESS:  Taylor Alvarez 81 y.o. female is here because of ***  MEDICAL HISTORY:  Past Medical History  Diagnosis Date  . Celiac disease   . GERD (gastroesophageal reflux disease)   . Hypertension   . Anxiety   . History of fall 2014    at [facility] store  . Deep vein thrombosis (DVT) 03/12/2014  . Cancer 12/2013    pancreatic ca  . Pancreatic cancer     SURGICAL HISTORY: Past Surgical History  Procedure Laterality Date  . Urge incontinence    . Hypertension    . Back surgery    . Partial hysterectomy with bladder tac    . Bilateral cataract surgery    . Ovarian cyst removal    . Tubal ligation    . Cesarean section    . Colonoscopy  06/06/2011    Procedure: COLONOSCOPY;  Surgeon: Rogene Houston, MD;  Location: [facility];  Service: Endoscopy;  Laterality: N/A;  . Interstim implant placement  04/30/13,05/12/13  . Wrist sprain Left   . Colonoscopy N/A 12/16/2013    Procedure: COLONOSCOPY;  Surgeon: Rogene Houston, MD;  Location: [facility];  Service: Endoscopy;  Laterality: N/A;  730-rescheduled 8/26 @ 100 Ann notified pt    SOCIAL HISTORY: History   Social History  . Marital Status: Divorced    Spouse Name: N/A    Number of Children: N/A  . Years of Education: N/A   Occupational History  . Not on file.   Social History Main Topics  . Smoking status: Former Smoker -- 0.50 packs/day for 5 years    Types: Cigarettes    Quit date: 06/05/1953  .  Smokeless tobacco: Never Used  . Alcohol Use: No  . Drug Use: No  . Sexual Activity: Yes    Birth Control/ Protection: Post-menopausal   Other Topics Concern  . Not on file   Social History Narrative   Divorced-lives alone in house with #3 flights of steps   Has #3 sons and #6 grandchildren   Retired Psychologist, prison and probation services & taught in [employer], active in church, likes cooking and travel.   Lives in [place], [place]   Currently has medical student renting a room from her and has a renter in house behind her home.    FAMILY HISTORY: Family History  Problem Relation Age of Onset  . Anesthesia problems Neg Hx   . Hypotension Neg Hx   . Malignant hyperthermia Neg Hx   . Pseudochol deficiency Neg Hx     ALLERGIES:  is allergic to cephalexin; clindamycin/lincomycin; nsaids; and cardizem.  MEDICATIONS:  Current Outpatient Prescriptions  Medication Sig Dispense Refill  . acetaminophen (TYLENOL) 500 MG tablet Take 500 mg by mouth every 6 (six) hours as needed. For pain    . aspirin EC 81 MG tablet Take 1 tablet (81 mg total) by mouth daily. 30 tablet 1  . atorvastatin (LIPITOR) 10 MG tablet Take 1 tablet (10 mg total) by mouth daily at 6 PM. 30 tablet 1  .  calcium carbonate (OS-CAL) 600 MG TABS Take 600 mg by mouth daily.     . Cholecalciferol (VITAMIN D) 2000 UNITS CAPS Take 2,000 Units by mouth daily.    Marland Kitchen docusate sodium (COLACE) 100 MG capsule Take 100 mg by mouth at bedtime.     . enoxaparin (LOVENOX) 100 MG/ML injection Inject 1 mL (100 mg total) into the skin daily. 30 Syringe 0  . ferrous sulfate 325 (65 FE) MG tablet Take 325 mg by mouth daily with breakfast.    . HYDROcodone-acetaminophen (NORCO/VICODIN) 5-325 MG per tablet Take 1 tablet by mouth every 6 (six) hours as needed for moderate pain. 100 tablet 0  . Lactobacillus (FLORAJEN ACIDOPHILUS) CAPS Take 1 capsule by mouth daily.     Marland Kitchen lidocaine-prilocaine (EMLA) cream Apply 1 application topically as needed.  Apply to [facility] site 1-2 hours prior to stick and cover with plastic wrap 30 g 11  . MAGNESIUM CITRATE PO Take 400 mg by mouth daily as needed (for constipation).     . Multiple Vitamins-Minerals (WOMENS 50+ MULTI VITAMIN/MIN PO) Take 1 tablet by mouth daily.     . nitrofurantoin (MACRODANTIN) 100 MG capsule Take 100 mg by mouth every morning.     . Nutritional Supplements (ENSURE COMPLETE SHAKE) LIQD Take 1 Bottle by mouth 2 (two) times daily. 60 Bottle 5  . prochlorperazine (COMPAZINE) 5 MG tablet Take 1-2 tablets (5-10 mg total) by mouth every 6 (six) hours as needed for nausea or vomiting. 60 tablet 1  . sertraline (ZOLOFT) 50 MG tablet Take 50 mg by mouth daily.       No current facility-administered medications for this visit.   Facility-Administered Medications Ordered in Other Visits  Medication Dose Route Frequency Provider Last Rate Last Dose  . 0.9 %  sodium chloride infusion   Intravenous Continuous D Kevin Allred, PA-C        ROS  PHYSICAL EXAMINATION: ECOG PERFORMANCE STATUS: {CHL ONC ECOG [phone]}  There were no vitals filed for this visit. There were no vitals filed for this visit.   Physical Exam   LABORATORY DATA:  I have reviewed the data as listed Lab Results  Component Value Date   WBC 11.2* 04/23/2014   HGB 10.7* 04/23/2014   HCT 34.3* 04/23/2014   MCV 84.3 04/23/2014   PLT 263 04/23/2014     Chemistry      Component Value Date/Time   NA 140 04/23/2014 0635   NA 140 04/09/2014 1001   K 4.3 04/23/2014 0635   K 3.8 04/09/2014 1001   CL 106 04/23/2014 0635   CO2 28 04/23/2014 0635   CO2 26 04/09/2014 1001   BUN 15 04/23/2014 0635   BUN 13.5 04/09/2014 1001   CREATININE 0.61 04/23/2014 0635   CREATININE 0.8 04/09/2014 1001   CREATININE 0.74 12/22/2013 1214      Component Value Date/Time   CALCIUM 8.8 04/23/2014 0635   CALCIUM 9.2 04/09/2014 1001   ALKPHOS 250* 04/23/2014 0635   ALKPHOS 276* 04/09/2014 1001   AST 58* 04/23/2014 0635   AST  66* 04/09/2014 1001   ALT 40* 04/23/2014 0635   ALT 73* 04/09/2014 1001   BILITOT 0.6 04/23/2014 0635   BILITOT 0.38 04/09/2014 1001       RADIOGRAPHIC STUDIES: I have personally reviewed the radiological images as listed and agreed with the findings in the report. Ct Head Wo Contrast  04/22/2014   CLINICAL DATA:  Left arm and leg weakness.  EXAM: CT HEAD WITHOUT  CONTRAST  TECHNIQUE: Contiguous axial images were obtained from the base of the skull through the vertex without intravenous contrast.  COMPARISON:  01/23/2012  FINDINGS: No intracranial hemorrhage, mass effect, or midline shift. No hydrocephalus. The basilar cisterns are patent. No evidence of territorial infarct. No intracranial fluid collection. Generalized atrophy is stable from prior. Calvarium is intact. Included paranasal sinuses and mastoid air cells are well aerated.  IMPRESSION: Generalized atrophy, no acute intracranial process.   Electronically Signed   By: Jeb Levering M.D.   On: 04/22/2014 16:04   US Carotid Bilateral  04/23/2014   CLINICAL DATA:  TIA, left arm and leg weakness, hypertension and history of pancreatic carcinoma.  EXAM: BILATERAL CAROTID DUPLEX ULTRASOUND  TECHNIQUE: Pearline Cables scale imaging, color Doppler and duplex ultrasound were performed of bilateral carotid and vertebral arteries in the neck.  COMPARISON:  07/29/2007  FINDINGS: Criteria: Quantification of carotid stenosis is based on velocity parameters that correlate the residual internal carotid diameter with NASCET-based stenosis levels, using the diameter of the distal internal carotid lumen as the denominator for stenosis measurement.  The following velocity measurements were obtained:  RIGHT  ICA:  87/28 cm/sec  CCA:  29/51 cm/sec  SYSTOLIC ICA/CCA RATIO:  1.2  DIASTOLIC ICA/CCA RATIO:  2.1  ECA:  75 cm/sec  LEFT  ICA:  96/24 cm/sec  CCA:  88/41 cm/sec  SYSTOLIC ICA/CCA RATIO:  1.1  DIASTOLIC ICA/CCA RATIO:  2.0  ECA:  92 cm/sec  RIGHT CAROTID ARTERY:  The common carotid artery shows intimal thickening. Mild amount of calcified plaque is seen at the origin of the external carotid artery. No plaque is identified in the internal carotid artery. There is no evidence of ICA stenosis with normal velocities and waveforms obtained.  RIGHT VERTEBRAL ARTERY: Antegrade flow with normal waveform and velocity.  LEFT CAROTID ARTERY: Intimal thickening present in the common carotid artery. Mild amount of calcified plaque is present at the origin of the external carotid artery. The internal carotid artery is normal in appearance without evidence of plaque or elevated velocities. No evidence of ICA stenosis.  LEFT VERTEBRAL ARTERY: Antegrade flow with normal waveform and velocity.  IMPRESSION: No evidence common carotid or internal carotid artery stenosis in the neck. Mild calcified plaque is seen at the origins of the external carotid arteries bilaterally.   Electronically Signed   By: Aletta Edouard M.D.   On: 04/23/2014 10:37    ASSESSMENT & PLAN:  No problem-specific assessment & plan notes found for this encounter.  No orders of the defined types were placed in this encounter.    All questions were answered. The patient knows to call the clinic with any problems, questions or concerns. I spent {CHL ONC TIME VISIT - YSAYT:[id number]} counseling the patient face to face. The total time spent in the appointment was {CHL ONC TIME VISIT - FTDDU:[id number]} and more than 50% was on counseling.     Molli Hazard, MD 04/25/2014 8:58 PM

## 2014-04-26 ENCOUNTER — Ambulatory Visit (HOSPITAL_COMMUNITY): Payer: Medicare Other | Admitting: Hematology & Oncology

## 2014-04-26 ENCOUNTER — Telehealth: Payer: Self-pay | Admitting: *Deleted

## 2014-04-26 NOTE — Telephone Encounter (Signed)
Reports having a TIA on 04/22/14 with 2 day inpatient stay. Home now.

## 2014-04-27 NOTE — Care Management Utilization Note (Signed)
UR completed 

## 2014-04-30 ENCOUNTER — Telehealth: Payer: Self-pay | Admitting: *Deleted

## 2014-04-30 NOTE — Telephone Encounter (Signed)
Message from pt's niece Dorian Furnace asking when pt should have chemo since she had a "mini TIA last week." Returned call to pt, instructed her to call Dr. Donald Pore office to discuss whether 1/21 is appropriate. (1/4 visit canceled due to hospitalization.) Pt voiced understanding.

## 2014-05-08 NOTE — Progress Notes (Signed)
Erroneous encounter

## 2014-05-11 ENCOUNTER — Other Ambulatory Visit: Payer: Self-pay | Admitting: Oncology

## 2014-05-13 ENCOUNTER — Encounter (HOSPITAL_COMMUNITY): Payer: BC Managed Care – PPO | Attending: Hematology & Oncology | Admitting: Hematology & Oncology

## 2014-05-13 VITALS — BP 103/77 | HR 96 | Temp 97.6°F | Resp 18 | Wt 138.5 lb

## 2014-05-13 DIAGNOSIS — I10 Essential (primary) hypertension: Secondary | ICD-10-CM | POA: Insufficient documentation

## 2014-05-13 DIAGNOSIS — Z7901 Long term (current) use of anticoagulants: Secondary | ICD-10-CM

## 2014-05-13 DIAGNOSIS — I82401 Acute embolism and thrombosis of unspecified deep veins of right lower extremity: Secondary | ICD-10-CM | POA: Diagnosis not present

## 2014-05-13 DIAGNOSIS — C252 Malignant neoplasm of tail of pancreas: Secondary | ICD-10-CM

## 2014-05-13 DIAGNOSIS — C259 Malignant neoplasm of pancreas, unspecified: Secondary | ICD-10-CM | POA: Insufficient documentation

## 2014-05-13 DIAGNOSIS — F419 Anxiety disorder, unspecified: Secondary | ICD-10-CM | POA: Insufficient documentation

## 2014-05-13 NOTE — Patient Instructions (Signed)
[place] at [facility] Discharge Instructions  RECOMMENDATIONS MADE BY THE CONSULTANT AND ANY TEST RESULTS WILL BE SENT TO YOUR REFERRING PHYSICIAN.  Return on Monday for chemo teaching with [facility] or [place]. My number Hildred Alamin) is [phone].   Chemo to start on Thursday January 28 @ 9:15. The pump will be removed on Saturday. The nurse treating you will tell you what time to come to the lobby at [facility].  Drugs to be used: Oxaliplatin, Leucovorin, and 5FU   Thank you for choosing [place] at [facility] to provide your oncology and hematology care.  To afford each patient quality time with our provider, please arrive at least 15 minutes before your scheduled appointment time.    You need to re-schedule your appointment should you arrive 10 or more minutes late.  We strive to give you quality time with our providers, and arriving late affects you and other patients whose appointments are after yours.  Also, if you no show three or more times for appointments you may be dismissed from the clinic at the providers discretion.     Again, thank you for choosing [facility].  Our hope is that these requests will decrease the amount of time that you wait before being seen by our physicians.       _____________________________________________________________  Should you have questions after your visit to [facility], please contact our office at [phone] between the hours of 8:30 a.m. and 4:30 p.m.  Voicemails left after 4:30 p.m. will not be returned until the following business day.  For prescription refill requests, have your pharmacy contact our office.   Oxaliplatin Injection What is this medicine? OXALIPLATIN (ox AL i PLA tin) is a chemotherapy drug. It targets fast dividing cells, like cancer cells, and causes these cells to die. This medicine is used to treat cancers of the colon and rectum, and many other  cancers. This medicine may be used for other purposes; ask your health care provider or pharmacist if you have questions. COMMON BRAND NAME(S): Eloxatin What should I tell my health care provider before I take this medicine? They need to know if you have any of these conditions: -kidney disease -an unusual or allergic reaction to oxaliplatin, other chemotherapy, other medicines, foods, dyes, or preservatives -pregnant or trying to get pregnant -breast-feeding How should I use this medicine? This drug is given as an infusion into a vein. It is administered in a hospital or clinic by a specially trained health care professional. Talk to your pediatrician regarding the use of this medicine in children. Special care may be needed. Overdosage: If you think you have taken too much of this medicine contact a poison control center or emergency room at once. NOTE: This medicine is only for you. Do not share this medicine with others. What if I miss a dose? It is important not to miss a dose. Call your doctor or health care professional if you are unable to keep an appointment. What may interact with this medicine? -medicines to increase blood counts like filgrastim, pegfilgrastim, sargramostim -probenecid -some antibiotics like amikacin, gentamicin, neomycin, polymyxin B, streptomycin, tobramycin -zalcitabine Talk to your doctor or health care professional before taking any of these medicines: -acetaminophen -aspirin -ibuprofen -ketoprofen -naproxen This list may not describe all possible interactions. Give your health care provider a list of all the medicines, herbs, non-prescription drugs, or dietary supplements you use. Also tell them if  you smoke, drink alcohol, or use illegal drugs. Some items may interact with your medicine. What should I watch for while using this medicine? Your condition will be monitored carefully while you are receiving this medicine. You will need important blood work  done while you are taking this medicine. This medicine can make you more sensitive to cold. Do not drink cold drinks or use ice. Cover exposed skin before coming in contact with cold temperatures or cold objects. When out in cold weather wear warm clothing and cover your mouth and nose to warm the air that goes into your lungs. Tell your doctor if you get sensitive to the cold. This drug may make you feel generally unwell. This is not uncommon, as chemotherapy can affect healthy cells as well as cancer cells. Report any side effects. Continue your course of treatment even though you feel ill unless your doctor tells you to stop. In some cases, you may be given additional medicines to help with side effects. Follow all directions for their use. Call your doctor or health care professional for advice if you get a fever, chills or sore throat, or other symptoms of a cold or flu. Do not treat yourself. This drug decreases your body's ability to fight infections. Try to avoid being around people who are sick. This medicine may increase your risk to bruise or bleed. Call your doctor or health care professional if you notice any unusual bleeding. Be careful brushing and flossing your teeth or using a toothpick because you may get an infection or bleed more easily. If you have any dental work done, tell your dentist you are receiving this medicine. Avoid taking products that contain aspirin, acetaminophen, ibuprofen, naproxen, or ketoprofen unless instructed by your doctor. These medicines may hide a fever. Do not become pregnant while taking this medicine. Women should inform their doctor if they wish to become pregnant or think they might be pregnant. There is a potential for serious side effects to an unborn child. Talk to your health care professional or pharmacist for more information. Do not breast-feed an infant while taking this medicine. Call your doctor or health care professional if you get diarrhea. Do  not treat yourself. What side effects may I notice from receiving this medicine? Side effects that you should report to your doctor or health care professional as soon as possible: -allergic reactions like skin rash, itching or hives, swelling of the face, lips, or tongue -low blood counts - This drug may decrease the number of white blood cells, red blood cells and platelets. You may be at increased risk for infections and bleeding. -signs of infection - fever or chills, cough, sore throat, pain or difficulty passing urine -signs of decreased platelets or bleeding - bruising, pinpoint red spots on the skin, black, tarry stools, nosebleeds -signs of decreased red blood cells - unusually weak or tired, fainting spells, lightheadedness -breathing problems -chest pain, pressure -cough -diarrhea -jaw tightness -mouth sores -nausea and vomiting -pain, swelling, redness or irritation at the injection site -pain, tingling, numbness in the hands or feet -problems with balance, talking, walking -redness, blistering, peeling or loosening of the skin, including inside the mouth -trouble passing urine or change in the amount of urine Side effects that usually do not require medical attention (report to your doctor or health care professional if they continue or are bothersome): -changes in vision -constipation -hair loss -loss of appetite -metallic taste in the mouth or changes in taste -stomach pain This list  may not describe all possible side effects. Call your doctor for medical advice about side effects. You may report side effects to FDA at 1-800-FDA-1088. Where should I keep my medicine? This drug is given in a hospital or clinic and will not be stored at home. NOTE: This sheet is a summary. It may not cover all possible information. If you have questions about this medicine, talk to your doctor, pharmacist, or health care provider.  2015, Elsevier/Gold Standard. (2007-11-04  17:22:47) Fluorouracil, 5-FU injection What is this medicine? FLUOROURACIL, 5-FU (flure oh YOOR a sil) is a chemotherapy drug. It slows the growth of cancer cells. This medicine is used to treat many types of cancer like breast cancer, colon or rectal cancer, pancreatic cancer, and stomach cancer. This medicine may be used for other purposes; ask your health care provider or pharmacist if you have questions. COMMON BRAND NAME(S): Adrucil What should I tell my health care provider before I take this medicine? They need to know if you have any of these conditions: -blood disorders -dihydropyrimidine dehydrogenase (DPD) deficiency -infection (especially a virus infection such as chickenpox, cold sores, or herpes) -kidney disease -liver disease -malnourished, poor nutrition -recent or ongoing radiation therapy -an unusual or allergic reaction to fluorouracil, other chemotherapy, other medicines, foods, dyes, or preservatives -pregnant or trying to get pregnant -breast-feeding How should I use this medicine? This drug is given as an infusion or injection into a vein. It is administered in a hospital or clinic by a specially trained health care professional. Talk to your pediatrician regarding the use of this medicine in children. Special care may be needed. Overdosage: If you think you have taken too much of this medicine contact a poison control center or emergency room at once. NOTE: This medicine is only for you. Do not share this medicine with others. What if I miss a dose? It is important not to miss your dose. Call your doctor or health care professional if you are unable to keep an appointment. What may interact with this medicine? -allopurinol -cimetidine -dapsone -digoxin -hydroxyurea -leucovorin -levamisole -medicines for seizures like ethotoin, fosphenytoin, phenytoin -medicines to increase blood counts like filgrastim, pegfilgrastim, sargramostim -medicines that treat or  prevent blood clots like warfarin, enoxaparin, and dalteparin -methotrexate -metronidazole -pyrimethamine -some other chemotherapy drugs like busulfan, cisplatin, estramustine, vinblastine -trimethoprim -trimetrexate -vaccines Talk to your doctor or health care professional before taking any of these medicines: -acetaminophen -aspirin -ibuprofen -ketoprofen -naproxen This list may not describe all possible interactions. Give your health care provider a list of all the medicines, herbs, non-prescription drugs, or dietary supplements you use. Also tell them if you smoke, drink alcohol, or use illegal drugs. Some items may interact with your medicine. What should I watch for while using this medicine? Visit your doctor for checks on your progress. This drug may make you feel generally unwell. This is not uncommon, as chemotherapy can affect healthy cells as well as cancer cells. Report any side effects. Continue your course of treatment even though you feel ill unless your doctor tells you to stop. In some cases, you may be given additional medicines to help with side effects. Follow all directions for their use. Call your doctor or health care professional for advice if you get a fever, chills or sore throat, or other symptoms of a cold or flu. Do not treat yourself. This drug decreases your body's ability to fight infections. Try to avoid being around people who are sick. This medicine may increase your  risk to bruise or bleed. Call your doctor or health care professional if you notice any unusual bleeding. Be careful brushing and flossing your teeth or using a toothpick because you may get an infection or bleed more easily. If you have any dental work done, tell your dentist you are receiving this medicine. Avoid taking products that contain aspirin, acetaminophen, ibuprofen, naproxen, or ketoprofen unless instructed by your doctor. These medicines may hide a fever. Do not become pregnant while  taking this medicine. Women should inform their doctor if they wish to become pregnant or think they might be pregnant. There is a potential for serious side effects to an unborn child. Talk to your health care professional or pharmacist for more information. Do not breast-feed an infant while taking this medicine. Men should inform their doctor if they wish to father a child. This medicine may lower sperm counts. Do not treat diarrhea with over the counter products. Contact your doctor if you have diarrhea that lasts more than 2 days or if it is severe and watery. This medicine can make you more sensitive to the sun. Keep out of the sun. If you cannot avoid being in the sun, wear protective clothing and use sunscreen. Do not use sun lamps or tanning beds/booths. What side effects may I notice from receiving this medicine? Side effects that you should report to your doctor or health care professional as soon as possible: -allergic reactions like skin rash, itching or hives, swelling of the face, lips, or tongue -low blood counts - this medicine may decrease the number of white blood cells, red blood cells and platelets. You may be at increased risk for infections and bleeding. -signs of infection - fever or chills, cough, sore throat, pain or difficulty passing urine -signs of decreased platelets or bleeding - bruising, pinpoint red spots on the skin, black, tarry stools, blood in the urine -signs of decreased red blood cells - unusually weak or tired, fainting spells, lightheadedness -breathing problems -changes in vision -chest pain -mouth sores -nausea and vomiting -pain, swelling, redness at site where injected -pain, tingling, numbness in the hands or feet -redness, swelling, or sores on hands or feet -stomach pain -unusual bleeding Side effects that usually do not require medical attention (report to your doctor or health care professional if they continue or are bothersome): -changes in  finger or toe nails -diarrhea -dry or itchy skin -hair loss -headache -loss of appetite -sensitivity of eyes to the light -stomach upset -unusually teary eyes This list may not describe all possible side effects. Call your doctor for medical advice about side effects. You may report side effects to FDA at 1-800-FDA-1088. Where should I keep my medicine? This drug is given in a hospital or clinic and will not be stored at home. NOTE: This sheet is a summary. It may not cover all possible information. If you have questions about this medicine, talk to your doctor, pharmacist, or health care provider.  2015, Elsevier/Gold Standard. (2007-08-13 13:53:16) Leucovorin injection What is this medicine? LEUCOVORIN (loo koe VOR in) is used to prevent or treat the harmful effects of some medicines. This medicine is used to treat anemia caused by a low amount of folic acid in the body. It is also used with 5-fluorouracil (5-FU) to treat colon cancer. This medicine may be used for other purposes; ask your health care provider or pharmacist if you have questions. What should I tell my health care provider before I take this medicine? They need  to know if you have any of these conditions: -anemia from low levels of vitamin B-12 in the blood -an unusual or allergic reaction to leucovorin, folic acid, other medicines, foods, dyes, or preservatives -pregnant or trying to get pregnant -breast-feeding How should I use this medicine? This medicine is for injection into a muscle or into a vein. It is given by a health care professional in a hospital or clinic setting. Talk to your pediatrician regarding the use of this medicine in children. Special care may be needed. Overdosage: If you think you have taken too much of this medicine contact a poison control center or emergency room at once. NOTE: This medicine is only for you. Do not share this medicine with others. What if I miss a dose? This does not  apply. What may interact with this medicine? -capecitabine -fluorouracil -phenobarbital -phenytoin -primidone -trimethoprim-sulfamethoxazole This list may not describe all possible interactions. Give your health care provider a list of all the medicines, herbs, non-prescription drugs, or dietary supplements you use. Also tell them if you smoke, drink alcohol, or use illegal drugs. Some items may interact with your medicine. What should I watch for while using this medicine? Your condition will be monitored carefully while you are receiving this medicine. This medicine may increase the side effects of 5-fluorouracil, 5-FU. Tell your doctor or health care professional if you have diarrhea or mouth sores that do not get better or that get worse. What side effects may I notice from receiving this medicine? Side effects that you should report to your doctor or health care professional as soon as possible: -allergic reactions like skin rash, itching or hives, swelling of the face, lips, or tongue -breathing problems -fever, infection -mouth sores -unusual bleeding or bruising -unusually weak or tired Side effects that usually do not require medical attention (report to your doctor or health care professional if they continue or are bothersome): -constipation or diarrhea -loss of appetite -nausea, vomiting This list may not describe all possible side effects. Call your doctor for medical advice about side effects. You may report side effects to FDA at 1-800-FDA-1088. Where should I keep my medicine? This drug is given in a hospital or clinic and will not be stored at home. NOTE: This sheet is a summary. It may not cover all possible information. If you have questions about this medicine, talk to your doctor, pharmacist, or health care provider.  2015, Elsevier/Gold Standard. (2007-10-14 16:50:29)

## 2014-05-13 NOTE — Progress Notes (Signed)
[facility] CONSULT NOTE  Patient Care Team: Glenda Chroman, MD as PCP - General (Internal Medicine) Ladell Pier, MD as Consulting Physician (Oncology) Renda Rolls, MD as Referring Physician (Internal Medicine)  CHIEF COMPLAINTS/PURPOSE OF CONSULTATION:  Stage IV Pancreatic Cancer  HISTORY OF PRESENTING ILLNESS:  Taylor Alvarez 81 y.o. female is here because of pancreatic cancer.  She reports she has always been very active and independent. She was in [place] visiting one of her sons and after she returned home went for her routine annual exam.  Blood was found in her stool.  She had a C-scope with Dr. Laural Golden on August 26 of 2015. 2 lesions were noted, one in the splenic flexure and another in the sigmoid colon. Biopsy showed metastatic adenocarcinoma, positive for cytokeratin 7 and negative for CD X2, ER, PR. She underwent CT scan of the chest abdomen and pelvis on 12/23/2013. There were multiple low attenuation foci in the liver which were suspicious for metastatic disease. A mass in the tail of the pancreas measuring 4.9 x 2.4 x 3 cm was highly suspicious for pancreatic neoplasm. The mass extended into the medial margin of the spleen and into the splenic flexure of the colon. She was seen at [facility] where surgery was not recommended and hospice was discussed. She was seen by Dr. Berneice Gandy on 01/26/2014 where several treatment options were discussed including clinical trials. She opted to come back to [facility] to be treated with Gemzar and Abraxane. In spite of several cycles of Gemzar and Abraxane her CA-19-9 has markedly risen and she feels she has had some physical decline. She is still however quite active, and is out every day. Her course has been complicated by a DVT and she is now on Lovenox. She was also admitted in December for a TIA evaluation. She is recently fallen and feels that has set her back some. She relies more on a walker to get out. She is still out every  day.  She is here today for discussion of additional treatment options.    MEDICAL HISTORY:  Past Medical History  Diagnosis Date  . Celiac disease   . GERD (gastroesophageal reflux disease)   . Hypertension   . Anxiety   . History of fall 2014    at [facility] store  . Deep vein thrombosis (DVT) 03/12/2014  . Cancer 12/2013    pancreatic ca  . Pancreatic cancer     SURGICAL HISTORY: Past Surgical History  Procedure Laterality Date  . Urge incontinence    . Hypertension    . Back surgery    . Partial hysterectomy with bladder tac    . Bilateral cataract surgery    . Ovarian cyst removal    . Tubal ligation    . Cesarean section    . Colonoscopy  06/06/2011    Procedure: COLONOSCOPY;  Surgeon: Rogene Houston, MD;  Location: [facility];  Service: Endoscopy;  Laterality: N/A;  . Interstim implant placement  04/30/13,05/12/13  . Wrist sprain Left   . Colonoscopy N/A 12/16/2013    Procedure: COLONOSCOPY;  Surgeon: Rogene Houston, MD;  Location: [facility];  Service: Endoscopy;  Laterality: N/A;  730-rescheduled 8/26 @ 100 Ann notified pt    SOCIAL HISTORY: History   Social History  . Marital Status: Divorced    Spouse Name: N/A    Number of Children: N/A  . Years of Education: N/A   Occupational History  . Not  on file.   Social History Main Topics  . Smoking status: Former Smoker -- 0.50 packs/day for 5 years    Types: Cigarettes    Quit date: 06/05/1953  . Smokeless tobacco: Never Used  . Alcohol Use: No  . Drug Use: No  . Sexual Activity: Yes    Birth Control/ Protection: Post-menopausal   Other Topics Concern  . Not on file   Social History Narrative   Divorced-lives alone in house with #3 flights of steps   Has #3 sons and #6 grandchildren   Retired Psychologist, prison and probation services & taught in [employer], active in church, likes cooking and travel.   Lives in [place], [place]   Currently has medical student renting a room from her and has a renter  in house behind her home.    FAMILY HISTORY: Family History  Problem Relation Age of Onset  . Anesthesia problems Neg Hx   . Hypotension Neg Hx   . Malignant hyperthermia Neg Hx   . Pseudochol deficiency Neg Hx    indicated that her mother is deceased. She indicated that her father is deceased. She indicated that her sister is deceased. She indicated that her son is alive. She indicated that her other is alive.   ALLERGIES:  is allergic to cephalexin; clindamycin/lincomycin; nsaids; and cardizem.  MEDICATIONS:  Current Outpatient Prescriptions  Medication Sig Dispense Refill  . acetaminophen (TYLENOL) 500 MG tablet Take 500 mg by mouth every 6 (six) hours as needed. For pain    . aspirin EC 81 MG tablet Take 1 tablet (81 mg total) by mouth daily. 30 tablet 1  . atorvastatin (LIPITOR) 10 MG tablet Take 1 tablet (10 mg total) by mouth daily at 6 PM. 30 tablet 1  . calcium carbonate (OS-CAL) 600 MG TABS Take 600 mg by mouth daily.     . Cholecalciferol (VITAMIN D) 2000 UNITS CAPS Take 2,000 Units by mouth daily.    Marland Kitchen docusate sodium (COLACE) 100 MG capsule Take 100 mg by mouth at bedtime.     . enoxaparin (LOVENOX) 100 MG/ML injection INJECT 1ML INTO THE SKIN EVERY DAY 30 mL 0  . ferrous sulfate 325 (65 FE) MG tablet Take 325 mg by mouth daily with breakfast.    . HYDROcodone-acetaminophen (NORCO/VICODIN) 5-325 MG per tablet Take 1 tablet by mouth every 6 (six) hours as needed for moderate pain. 100 tablet 0  . Lactobacillus (FLORAJEN ACIDOPHILUS) CAPS Take 1 capsule by mouth daily.     Marland Kitchen lidocaine-prilocaine (EMLA) cream Apply 1 application topically as needed. Apply to [facility] site 1-2 hours prior to stick and cover with plastic wrap 30 g 11  . MAGNESIUM CITRATE PO Take 400 mg by mouth daily as needed (for constipation).     . Multiple Vitamins-Minerals (WOMENS 50+ MULTI VITAMIN/MIN PO) Take 1 tablet by mouth daily.     . nitrofurantoin (MACRODANTIN) 100 MG capsule Take 100 mg by mouth  every morning.     . Nutritional Supplements (ENSURE COMPLETE SHAKE) LIQD Take 1 Bottle by mouth 2 (two) times daily. 60 Bottle 5  . prochlorperazine (COMPAZINE) 5 MG tablet Take 1-2 tablets (5-10 mg total) by mouth every 6 (six) hours as needed for nausea or vomiting. 60 tablet 1  . sertraline (ZOLOFT) 50 MG tablet Take 50 mg by mouth daily.      . brimonidine (ALPHAGAN) 0.15 % ophthalmic solution     . dextrose 5 % SOLN 1,000 mL with fluorouracil 5 GM/100ML  SOLN Inject into the vein. To be given every 14 days. To begin on Jan 28    . leucovorin 50 MG injection Inject into the vein daily. To be given every 14 days. To begin on Jan 28    . OXALIPLATIN IV Inject into the vein. To be given every 14 days. To begin on Jan 28    . sennosides-docusate sodium (SENOKOT-S) 8.6-50 MG tablet Take 1 tablet by mouth daily.     No current facility-administered medications for this visit.   Facility-Administered Medications Ordered in Other Visits  Medication Dose Route Frequency Provider Last Rate Last Dose  . 0.9 %  sodium chloride infusion   Intravenous Continuous D Rowe Robert, PA-C        Review of Systems  Constitutional: Positive for malaise/fatigue.  HENT: Negative.   Eyes: Negative.   Respiratory: Negative.   Cardiovascular: Negative.   Gastrointestinal: Negative.   Genitourinary: Negative.   Musculoskeletal: Positive for joint pain.  Skin: Negative.   Neurological: Positive for weakness. Negative for dizziness, tingling, tremors, sensory change, speech change, focal weakness, seizures and loss of consciousness.  Endo/Heme/Allergies: Negative.   Psychiatric/Behavioral: Negative.     PHYSICAL EXAMINATION: ECOG PERFORMANCE STATUS: 1 - Symptomatic but completely ambulatory  Filed Vitals:   05/13/14 1400  BP: 103/77  Pulse: 96  Temp: 97.6 F (36.4 C)  Resp: 18   Filed Weights   05/13/14 1400  Weight: 138 lb 8 oz (62.823 kg)     Physical Exam  Constitutional: She is oriented  to person, place, and time and well-developed, well-nourished, and in no distress.  Pleasant well groomed. Able to get onto the exam table without assistance.  Appears younger than stated age.  HENT:  Head: Normocephalic and atraumatic.  Nose: Nose normal.  Mouth/Throat: Oropharynx is clear and moist. No oropharyngeal exudate.  Eyes: Conjunctivae and EOM are normal. Pupils are equal, round, and reactive to light. Right eye exhibits no discharge. Left eye exhibits no discharge. No scleral icterus.  Neck: Normal range of motion. Neck supple. No tracheal deviation present. No thyromegaly present.  Cardiovascular: Normal rate, regular rhythm and normal heart sounds.  Exam reveals no gallop and no friction rub.   No murmur heard. Pulmonary/Chest: Effort normal and breath sounds normal. She has no wheezes. She has no rales.  Abdominal: Soft. Bowel sounds are normal. She exhibits no distension and no mass. There is no tenderness. There is no rebound and no guarding.  Musculoskeletal: Normal range of motion. She exhibits no edema.  Lymphadenopathy:    She has no cervical adenopathy.  Neurological: She is alert and oriented to person, place, and time. She has normal reflexes. No cranial nerve deficit. Gait normal. Coordination normal.  Skin: Skin is warm and dry. No rash noted.  Psychiatric: Mood, memory, affect and judgment normal.  Nursing note and vitals reviewed.    LABORATORY DATA:  I have reviewed the data as listed Lab Results  Component Value Date   WBC 9.7 05/20/2014   HGB 11.1* 05/20/2014   HCT 35.3* 05/20/2014   MCV 86.1 05/20/2014   PLT 229 05/20/2014     Chemistry      Component Value Date/Time   NA 139 05/20/2014 0931   NA 140 04/09/2014 1001   K 3.7 05/20/2014 0931   K 3.8 04/09/2014 1001   CL 106 05/20/2014 0931   CO2 27 05/20/2014 0931   CO2 26 04/09/2014 1001   BUN 19 05/20/2014 0931   BUN 13.5  04/09/2014 1001   CREATININE 0.81 05/20/2014 0931   CREATININE 0.8  04/09/2014 1001   CREATININE 0.74 12/22/2013 1214      Component Value Date/Time   CALCIUM 8.7 05/20/2014 0931   CALCIUM 9.2 04/09/2014 1001   ALKPHOS 268* 05/20/2014 0931   ALKPHOS 276* 04/09/2014 1001   AST 62* 05/20/2014 0931   AST 66* 04/09/2014 1001   ALT 41* 05/20/2014 0931   ALT 73* 04/09/2014 1001   BILITOT 0.6 05/20/2014 0931   BILITOT 0.38 04/09/2014 1001       RADIOGRAPHIC STUDIES: I have personally reviewed the radiological images as listed and agreed with the findings in the report.  ASSESSMENT & PLAN:  Cancer of pancreas, tail Pleasant 81 year old female with stage IV adenocarcinoma of the pancreas. She has failed Gemzar and Abraxane with rise in her tumor marker and also declined clinically. She still however is quite independent with performance status of 1. CT scans performed on 04/08/2014 showed progressive disease in the liver, and also possibly early carcinomatosis. She is here today to discuss other treatment options. She does not want to travel for a clinical trial and also feels that traveling to [place] has become too difficult. X  We discussed the NCCN guidelines and additional options for therapy. Discussed single agent 5-FU, specifically Xeloda. We also addressed 5-FU and oxaliplatin combinations. I do think she is well enough to try additional therapy and we have opted for 5-FU oxaliplatin combination after a thorough discussion.  I spent some time addressing end-of-life issues with she and her son. They have discussed them somewhat but she still has a lot of planning to do. I have encouraged her to try to get all of her end-of-life issues in order now before she becomes more ill from her disease.  We will plan on chemotherapy teaching and hopefully starting her on therapy next week per her wishes. I have advised her to call with any questions problems or concerns prior to her follow-up. I will have her meet with [facility] or patient navigator today prior to  leaving the clinic.   Deep vein thrombosis (DVT) She is currently on Lovenox for DVT. She goes to her local pharmacy for administration and she does not feel comfortable self administering. I have advised her we can do additional teaching if she wishes here. She however likes to get out daily and is going to continue going to her local pharmacy. I did discuss other anticoagulation options. She currently feels comfortable with Lovenox.    No orders of the defined types were placed in this encounter.    All questions were answered. The patient knows to call the clinic with any problems, questions or concerns.    Molli Hazard, MD MD 05/28/2014 8:30 AM

## 2014-05-16 NOTE — Patient Instructions (Addendum)
Taylor Alvarez   CHEMOTHERAPY INSTRUCTIONS  Oxaliplatin - anaphylactic reaction, neurotoxicity (i.e., headache, fatigue, difficulty sleeping, pain). Peripheral neuropathy (numbness/tingling/burning in hands/fingers/feet/toes) - will be aggravated by cold/cool temperatures. We need to know when you develop peripheral neuropathy so that we can monitor it and treat if necessary. Nausea/vomiting, diarrhea, bone marrow suppression (lowers white blood cells (fight infection), lowers red blood cells (make up your blood), lowers platelets (help blood to clot). Pulmonary fibrosis. Once you have received Oxaliplatin do NOT eat or drinking anything cold/cool for 5-10 days! Do NOT breathe in cold/cool air and do NOT touch anything cold for 5-10 days. The time frame varies from patient to patient on the length of time you must abstain from the above mentioned. Best advice is to wait at least 5 days before attempting to reintroduce cold/cool back into life. Slowly reintroduce cool/cold things! Wear gloves when getting items out of the refrigerator (of course, these would be things you are going to heat to eat)!    (this medication takes 2 hours to infuse)  Leucovorin - this is a medication that is not chemo but given with chemo. This med "rescues" the healthy cells before we administer the drug 5FU. This makes the 5FU work better. (this medication takes 2 hours to infuse - it infuses @ the same time as the Oxaliplatin)  5FU: bone marrow suppression (low white blood cells - wbcs fight infection, low red blood cells - rbcs make up your blood, low platelets - this is what makes your blood clot, nausea/vomiting, diarrhea, mouth sores, hair loss, dry skin, ocular toxicities (increased tear production, sensitivity to light). You must wear sunscreen/sunglasses. Cover your skin when out in sunlight. You will get burned very easily. (this medication will be given first as an IV push (takes approx  5-10 min) then the nurse will connect you to your ambulatory pump with this medication in a clear bag. This will run for 46 hours.  You will then return, we remove the port-a-cath needle and you are done)  POTENTIAL SIDE EFFECTS OF TREATMENT: Increased Susceptibility to Infection, Vomiting, Constipation, Hair Thinning, Changes in Character of Skin and Nails (brittleness, dryness,etc.), Pigment Changes (darkening of veins, nail beds, palms of hands, soles of feet, etc.), Bone Marrow Suppression, Nausea, Diarrhea, Sun Sensitivity and Mouth Sores   EDUCATIONAL MATERIALS GIVEN AND REVIEWED: Chemotherapy and You booklet\ Specific Instructions Sheets: Oxaliplatin, Leucovorin, 5FU, Dexamethasone, Zofran   SELF CARE ACTIVITIES WHILE ON CHEMOTHERAPY: Increase your fluid intake 48 hours prior to treatment and drink at least 2 quarts per day after treatment., No alcohol intake., No aspirin or other medications unless approved by your oncologist., Eat foods that are light and easy to digest., No fried, fatty, or spicy foods immediately before or after treatment., Have teeth cleaned professionally before starting treatment. Keep dentures and partial plates clean., Use soft toothbrush and do not use mouthwashes that contain alcohol. Biotene is a good mouthwash that is available at most pharmacies or may be ordered by calling [phone]., Use warm salt water gargles (1 teaspoon salt per 1 quart warm water) before and after meals and at bedtime. Or you may rinse with 2 tablespoons of three -percent hydrogen peroxide mixed in eight ounces of water., Always use sunscreen with SPF (Sun Protection Factor) of 30 or higher., Use your nausea medication as directed to prevent nausea., Use your stool softener or laxative as directed to prevent constipation. and Use your anti-diarrheal medication as directed to stop  diarrhea.  Please wash your hands for at least 30 seconds using warm soapy water. Handwashing is the #1 way  to prevent the spread of germs. Stay away from sick people or people who are getting over a cold. If you develop respiratory systems such as green/yellow mucus production or productive cough or persistent cough let us know and we will see if you need an antibiotic. It is a good idea to keep a pair of gloves on when going into grocery stores/[employer] to decrease your risk of coming into contact with germs on the carts, etc. Carry alcohol hand gel with you at all times and use it frequently if out in public. All foods need to be cooked thoroughly. No raw foods. No medium or undercooked meats, eggs. If your food is cooked medium well, it does not need to be hot pink or saturated with bloody liquid at all. Vegetables and fruits need to be washed/rinsed under the faucet with a dish detergent before being consumed. You can eat raw fruits and vegetables unless we tell you otherwise but it would be best if you cooked them or bought frozen. Do not eat off of salad bars or hot bars unless you really trust the cleanliness of the restaurant. If you need dental work, please let Dr. Whitney Muse know before you go for your appointment so that we can coordinate the best possible time for you in regards to your chemo regimen. You need to also let your dentist know that you are actively taking chemo. We may need to do labs prior to your dental appointment. We also want your bowels moving at least every other day. If this is not happening, we need to know so that we can get you on a bowel regimen to help you go.       MEDICATIONS: You have been given prescriptions for the following medications:  EMLA cream. Apply a quarter size amount to port site 1 hour prior to chemo. Do not rub in. Cover with plastic wrap.   Prochlorperazine/Compazine 5mg  tablet. Take 1-2 tablets every 6 hours as needed for nausea/vomiting.   Over-the -Counter Meds:  Senna - this is a mild laxative used to treat mild constipation. May take 1-8 tabs by  mouth daily as needed for mild constipation.  Milk of Magnesia - this is a laxative used to treat moderate to severe constipation. May take 2-4 tablespoons every 8 hours as needed. May increase to 8 tablespoons x 1 dose and if no bowel movement call the [facility].  Imodium - this is for diarrhea. Take 2 tabs after 1st loose stool and then 1 tab every 2 hours until you go a total of 12 hours without a loose stool. Call [facility] if loose stools continue.   SYMPTOMS TO REPORT AS SOON AS POSSIBLE AFTER TREATMENT:  FEVER GREATER THAN 100.5 F  CHILLS WITH OR WITHOUT FEVER  NAUSEA AND VOMITING THAT IS NOT CONTROLLED WITH YOUR NAUSEA MEDICATION  UNUSUAL SHORTNESS OF BREATH  UNUSUAL BRUISING OR BLEEDING  TENDERNESS IN MOUTH AND THROAT WITH OR WITHOUT PRESENCE OF ULCERS  URINARY PROBLEMS  BOWEL PROBLEMS  UNUSUAL RASH    Wear comfortable clothing and clothing appropriate for easy access to any Portacath or PICC line. Let us know if there is anything that we can do to make your therapy better!      I have been informed and understand all of the instructions given to me and have received a copy. I have been  instructed to call the clinic [phone] or my family physician as soon as possible for continued medical care, if indicated. I do not have any more questions at this time but understand that I may call the [facility] or the Patient Navigator at [phone] during office hours should I have questions or need assistance in obtaining follow-up care.           Oxaliplatin Injection What is this medicine? OXALIPLATIN (ox AL i PLA tin) is a chemotherapy drug. It targets fast dividing cells, like cancer cells, and causes these cells to die. This medicine is used to treat cancers of the colon and rectum, and many other cancers. This medicine may be used for other purposes; ask your health care provider or pharmacist if you have questions. COMMON BRAND NAME(S):  Eloxatin What should I tell my health care provider before I take this medicine? They need to know if you have any of these conditions: -kidney disease -an unusual or allergic reaction to oxaliplatin, other chemotherapy, other medicines, foods, dyes, or preservatives -pregnant or trying to get pregnant -breast-feeding How should I use this medicine? This drug is given as an infusion into a vein. It is administered in a hospital or clinic by a specially trained health care professional. Talk to your pediatrician regarding the use of this medicine in children. Special care may be needed. Overdosage: If you think you have taken too much of this medicine contact a poison control center or emergency room at once. NOTE: This medicine is only for you. Do not share this medicine with others. What if I miss a dose? It is important not to miss a dose. Call your doctor or health care professional if you are unable to keep an appointment. What may interact with this medicine? -medicines to increase blood counts like filgrastim, pegfilgrastim, sargramostim -probenecid -some antibiotics like amikacin, gentamicin, neomycin, polymyxin B, streptomycin, tobramycin -zalcitabine Talk to your doctor or health care professional before taking any of these medicines: -acetaminophen -aspirin -ibuprofen -ketoprofen -naproxen This list may not describe all possible interactions. Give your health care provider a list of all the medicines, herbs, non-prescription drugs, or dietary supplements you use. Also tell them if you smoke, drink alcohol, or use illegal drugs. Some items may interact with your medicine. What should I watch for while using this medicine? Your condition will be monitored carefully while you are receiving this medicine. You will need important blood work done while you are taking this medicine. This medicine can make you more sensitive to cold. Do not drink cold drinks or use ice. Cover exposed  skin before coming in contact with cold temperatures or cold objects. When out in cold weather wear warm clothing and cover your mouth and nose to warm the air that goes into your lungs. Tell your doctor if you get sensitive to the cold. This drug may make you feel generally unwell. This is not uncommon, as chemotherapy can affect healthy cells as well as cancer cells. Report any side effects. Continue your course of treatment even though you feel ill unless your doctor tells you to stop. In some cases, you may be given additional medicines to help with side effects. Follow all directions for their use. Call your doctor or health care professional for advice if you get a fever, chills or sore throat, or other symptoms of a cold or flu. Do not treat yourself. This drug decreases your body's ability to fight infections. Try to avoid being around  people who are sick. This medicine may increase your risk to bruise or bleed. Call your doctor or health care professional if you notice any unusual bleeding. Be careful brushing and flossing your teeth or using a toothpick because you may get an infection or bleed more easily. If you have any dental work done, tell your dentist you are receiving this medicine. Avoid taking products that contain aspirin, acetaminophen, ibuprofen, naproxen, or ketoprofen unless instructed by your doctor. These medicines may hide a fever. Do not become pregnant while taking this medicine. Women should inform their doctor if they wish to become pregnant or think they might be pregnant. There is a potential for serious side effects to an unborn child. Talk to your health care professional or pharmacist for more information. Do not breast-feed an infant while taking this medicine. Call your doctor or health care professional if you get diarrhea. Do not treat yourself. What side effects may I notice from receiving this medicine? Side effects that you should report to your doctor or health  care professional as soon as possible: -allergic reactions like skin rash, itching or hives, swelling of the face, lips, or tongue -low blood counts - This drug may decrease the number of white blood cells, red blood cells and platelets. You may be at increased risk for infections and bleeding. -signs of infection - fever or chills, cough, sore throat, pain or difficulty passing urine -signs of decreased platelets or bleeding - bruising, pinpoint red spots on the skin, black, tarry stools, nosebleeds -signs of decreased red blood cells - unusually weak or tired, fainting spells, lightheadedness -breathing problems -chest pain, pressure -cough -diarrhea -jaw tightness -mouth sores -nausea and vomiting -pain, swelling, redness or irritation at the injection site -pain, tingling, numbness in the hands or feet -problems with balance, talking, walking -redness, blistering, peeling or loosening of the skin, including inside the mouth -trouble passing urine or change in the amount of urine Side effects that usually do not require medical attention (report to your doctor or health care professional if they continue or are bothersome): -changes in vision -constipation -hair loss -loss of appetite -metallic taste in the mouth or changes in taste -stomach pain This list may not describe all possible side effects. Call your doctor for medical advice about side effects. You may report side effects to FDA at 1-800-FDA-1088. Where should I keep my medicine? This drug is given in a hospital or clinic and will not be stored at home. NOTE: This sheet is a summary. It may not cover all possible information. If you have questions about this medicine, talk to your doctor, pharmacist, or health care provider.  2015, [employer] Standard. (2007-11-04 17:22:47) Leucovorin injection What is this medicine? LEUCOVORIN (loo koe VOR in) is used to prevent or treat the harmful effects of some medicines. This  medicine is used to treat anemia caused by a low amount of folic acid in the body. It is also used with 5-fluorouracil (5-FU) to treat colon cancer. This medicine may be used for other purposes; ask your health care provider or pharmacist if you have questions. What should I tell my health care provider before I take this medicine? They need to know if you have any of these conditions: -anemia from low levels of vitamin B-12 in the blood -an unusual or allergic reaction to leucovorin, folic acid, other medicines, foods, dyes, or preservatives -pregnant or trying to get pregnant -breast-feeding How should I use this medicine? This medicine is for injection  into a muscle or into a vein. It is given by a health care professional in a hospital or clinic setting. Talk to your pediatrician regarding the use of this medicine in children. Special care may be needed. Overdosage: If you think you have taken too much of this medicine contact a poison control center or emergency room at once. NOTE: This medicine is only for you. Do not share this medicine with others. What if I miss a dose? This does not apply. What may interact with this medicine? -capecitabine -fluorouracil -phenobarbital -phenytoin -primidone -trimethoprim-sulfamethoxazole This list may not describe all possible interactions. Give your health care provider a list of all the medicines, herbs, non-prescription drugs, or dietary supplements you use. Also tell them if you smoke, drink alcohol, or use illegal drugs. Some items may interact with your medicine. What should I watch for while using this medicine? Your condition will be monitored carefully while you are receiving this medicine. This medicine may increase the side effects of 5-fluorouracil, 5-FU. Tell your doctor or health care professional if you have diarrhea or mouth sores that do not get better or that get worse. What side effects may I notice from receiving this  medicine? Side effects that you should report to your doctor or health care professional as soon as possible: -allergic reactions like skin rash, itching or hives, swelling of the face, lips, or tongue -breathing problems -fever, infection -mouth sores -unusual bleeding or bruising -unusually weak or tired Side effects that usually do not require medical attention (report to your doctor or health care professional if they continue or are bothersome): -constipation or diarrhea -loss of appetite -nausea, vomiting This list may not describe all possible side effects. Call your doctor for medical advice about side effects. You may report side effects to FDA at 1-800-FDA-1088. Where should I keep my medicine? This drug is given in a hospital or clinic and will not be stored at home. NOTE: This sheet is a summary. It may not cover all possible information. If you have questions about this medicine, talk to your doctor, pharmacist, or health care provider.  2015, [employer] Standard. (2007-10-14 16:50:29) Fluorouracil, 5-FU injection What is this medicine? FLUOROURACIL, 5-FU (flure oh YOOR a sil) is a chemotherapy drug. It slows the growth of cancer cells. This medicine is used to treat many types of cancer like breast cancer, colon or rectal cancer, pancreatic cancer, and stomach cancer. This medicine may be used for other purposes; ask your health care provider or pharmacist if you have questions. COMMON BRAND NAME(S): Adrucil What should I tell my health care provider before I take this medicine? They need to know if you have any of these conditions: -blood disorders -dihydropyrimidine dehydrogenase (DPD) deficiency -infection (especially a virus infection such as chickenpox, cold sores, or herpes) -kidney disease -liver disease -malnourished, poor nutrition -recent or ongoing radiation therapy -an unusual or allergic reaction to fluorouracil, other chemotherapy, other medicines,  foods, dyes, or preservatives -pregnant or trying to get pregnant -breast-feeding How should I use this medicine? This drug is given as an infusion or injection into a vein. It is administered in a hospital or clinic by a specially trained health care professional. Talk to your pediatrician regarding the use of this medicine in children. Special care may be needed. Overdosage: If you think you have taken too much of this medicine contact a poison control center or emergency room at once. NOTE: This medicine is only for you. Do not share this medicine with  others. What if I miss a dose? It is important not to miss your dose. Call your doctor or health care professional if you are unable to keep an appointment. What may interact with this medicine? -allopurinol -cimetidine -dapsone -digoxin -hydroxyurea -leucovorin -levamisole -medicines for seizures like ethotoin, fosphenytoin, phenytoin -medicines to increase blood counts like filgrastim, pegfilgrastim, sargramostim -medicines that treat or prevent blood clots like warfarin, enoxaparin, and dalteparin -methotrexate -metronidazole -pyrimethamine -some other chemotherapy drugs like busulfan, cisplatin, estramustine, vinblastine -trimethoprim -trimetrexate -vaccines Talk to your doctor or health care professional before taking any of these medicines: -acetaminophen -aspirin -ibuprofen -ketoprofen -naproxen This list may not describe all possible interactions. Give your health care provider a list of all the medicines, herbs, non-prescription drugs, or dietary supplements you use. Also tell them if you smoke, drink alcohol, or use illegal drugs. Some items may interact with your medicine. What should I watch for while using this medicine? Visit your doctor for checks on your progress. This drug may make you feel generally unwell. This is not uncommon, as chemotherapy can affect healthy cells as well as cancer cells. Report any side  effects. Continue your course of treatment even though you feel ill unless your doctor tells you to stop. In some cases, you may be given additional medicines to help with side effects. Follow all directions for their use. Call your doctor or health care professional for advice if you get a fever, chills or sore throat, or other symptoms of a cold or flu. Do not treat yourself. This drug decreases your body's ability to fight infections. Try to avoid being around people who are sick. This medicine may increase your risk to bruise or bleed. Call your doctor or health care professional if you notice any unusual bleeding. Be careful brushing and flossing your teeth or using a toothpick because you may get an infection or bleed more easily. If you have any dental work done, tell your dentist you are receiving this medicine. Avoid taking products that contain aspirin, acetaminophen, ibuprofen, naproxen, or ketoprofen unless instructed by your doctor. These medicines may hide a fever. Do not become pregnant while taking this medicine. Women should inform their doctor if they wish to become pregnant or think they might be pregnant. There is a potential for serious side effects to an unborn child. Talk to your health care professional or pharmacist for more information. Do not breast-feed an infant while taking this medicine. Men should inform their doctor if they wish to father a child. This medicine may lower sperm counts. Do not treat diarrhea with over the counter products. Contact your doctor if you have diarrhea that lasts more than 2 days or if it is severe and watery. This medicine can make you more sensitive to the sun. Keep out of the sun. If you cannot avoid being in the sun, wear protective clothing and use sunscreen. Do not use sun lamps or tanning beds/booths. What side effects may I notice from receiving this medicine? Side effects that you should report to your doctor or health care professional  as soon as possible: -allergic reactions like skin rash, itching or hives, swelling of the face, lips, or tongue -low blood counts - this medicine may decrease the number of white blood cells, red blood cells and platelets. You may be at increased risk for infections and bleeding. -signs of infection - fever or chills, cough, sore throat, pain or difficulty passing urine -signs of decreased platelets or bleeding - bruising, pinpoint red  spots on the skin, black, tarry stools, blood in the urine -signs of decreased red blood cells - unusually weak or tired, fainting spells, lightheadedness -breathing problems -changes in vision -chest pain -mouth sores -nausea and vomiting -pain, swelling, redness at site where injected -pain, tingling, numbness in the hands or feet -redness, swelling, or sores on hands or feet -stomach pain -unusual bleeding Side effects that usually do not require medical attention (report to your doctor or health care professional if they continue or are bothersome): -changes in finger or toe nails -diarrhea -dry or itchy skin -hair loss -headache -loss of appetite -sensitivity of eyes to the light -stomach upset -unusually teary eyes This list may not describe all possible side effects. Call your doctor for medical advice about side effects. You may report side effects to FDA at 1-800-FDA-1088. Where should I keep my medicine? This drug is given in a hospital or clinic and will not be stored at home. NOTE: This sheet is a summary. It may not cover all possible information. If you have questions about this medicine, talk to your doctor, pharmacist, or health care provider.  2015, [employer]. (2007-08-13 13:53:16) Ondansetron injection What is this medicine? ONDANSETRON (on DAN se tron) is used to treat nausea and vomiting caused by chemotherapy. It is also used to prevent or treat nausea and vomiting after surgery. This medicine may be used for other  purposes; ask your health care provider or pharmacist if you have questions. COMMON BRAND NAME(S): Zofran What should I tell my health care provider before I take this medicine? They need to know if you have any of these conditions: -heart disease -history of irregular heartbeat -liver disease -low levels of magnesium or potassium in the blood -an unusual or allergic reaction to ondansetron, granisetron, other medicines, foods, dyes, or preservatives -pregnant or trying to get pregnant -breast-feeding How should I use this medicine? This medicine is for infusion into a vein. It is given by a health care professional in a hospital or clinic setting. Talk to your pediatrician regarding the use of this medicine in children. Special care may be needed. Overdosage: If you think you have taken too much of this medicine contact a poison control center or emergency room at once. NOTE: This medicine is only for you. Do not share this medicine with others. What if I miss a dose? This does not apply. What may interact with this medicine? Do not take this medicine with any of the following medications: -apomorphine -certain medicines for fungal infections like fluconazole, itraconazole, ketoconazole, posaconazole, voriconazole -cisapride -dofetilide -dronedarone -pimozide -thioridazine -ziprasidone This medicine may also interact with the following medications: -carbamazepine -certain medicines for depression, anxiety, or psychotic disturbances -fentanyl -linezolid -MAOIs like Carbex, Eldepryl, Marplan, Nardil, and Parnate -methylene blue (injected into a vein) -other medicines that prolong the QT interval (cause an abnormal heart rhythm) -phenytoin -rifampicin -tramadol This list may not describe all possible interactions. Give your health care provider a list of all the medicines, herbs, non-prescription drugs, or dietary supplements you use. Also tell them if you smoke, drink alcohol,  or use illegal drugs. Some items may interact with your medicine. What should I watch for while using this medicine? Your condition will be monitored carefully while you are receiving this medicine. What side effects may I notice from receiving this medicine? Side effects that you should report to your doctor or health care professional as soon as possible: -allergic reactions like skin rash, itching or hives, swelling of the  face, lips, or tongue -breathing problems -confusion -dizziness -fast or irregular heartbeat -feeling faint or lightheaded, falls -fever and chills -loss of balance or coordination -seizures -sweating -swelling of the hands and feet -tightness in the chest -tremors -unusually weak or tired Side effects that usually do not require medical attention (report to your doctor or health care professional if they continue or are bothersome): -constipation or diarrhea -headache This list may not describe all possible side effects. Call your doctor for medical advice about side effects. You may report side effects to FDA at 1-800-FDA-1088. Where should I keep my medicine? This drug is given in a hospital or clinic and will not be stored at home. NOTE: This sheet is a summary. It may not cover all possible information. If you have questions about this medicine, talk to your doctor, pharmacist, or health care provider.  2015, Elsevier/Gold Standard. (2013-01-14 16:18:28) Dexamethasone injection What is this medicine? DEXAMETHASONE (dex a METH a sone) is a corticosteroid. It is used to treat inflammation of the skin, joints, lungs, and other organs. Common conditions treated include asthma, allergies, and arthritis. It is also used for other conditions, like blood disorders and diseases of the adrenal glands. This medicine may be used for other purposes; ask your health care provider or pharmacist if you have questions. COMMON BRAND NAME(S): Decadron, Solurex What should I  tell my health care provider before I take this medicine? They need to know if you have any of these conditions: -blood clotting problems -Cushing's syndrome -diabetes -glaucoma -heart problems or disease -high blood pressure -infection like herpes, measles, tuberculosis, or chickenpox -kidney disease -liver disease -mental problems -myasthenia gravis -osteoporosis -previous heart attack -seizures -stomach, ulcer or intestine disease including colitis and diverticulitis -thyroid problem -an unusual or allergic reaction to dexamethasone, corticosteroids, other medicines, lactose, foods, dyes, or preservatives -pregnant or trying to get pregnant -breast-feeding How should I use this medicine? This medicine is for injection into a muscle, joint, lesion, soft tissue, or vein. It is given by a health care professional in a hospital or clinic setting. Talk to your pediatrician regarding the use of this medicine in children. Special care may be needed. Overdosage: If you think you have taken too much of this medicine contact a poison control center or emergency room at once. NOTE: This medicine is only for you. Do not share this medicine with others. What if I miss a dose? This may not apply. If you are having a series of injections over a prolonged period, try not to miss an appointment. Call your doctor or health care professional to reschedule if you are unable to keep an appointment. What may interact with this medicine? Do not take this medicine with any of the following medications: -mifepristone, RU-486 -vaccines This medicine may also interact with the following medications: -amphotericin B -antibiotics like clarithromycin, erythromycin, and troleandomycin -aspirin and aspirin-like drugs -barbiturates like phenobarbital -carbamazepine -cholestyramine -cholinesterase inhibitors like donepezil, galantamine, rivastigmine, and  tacrine -cyclosporine -digoxin -diuretics -ephedrine -female hormones, like estrogens or progestins and birth control pills -indinavir -isoniazid -ketoconazole -medicines for diabetes -medicines that improve muscle tone or strength for conditions like myasthenia gravis -NSAIDs, medicines for pain and inflammation, like ibuprofen or naproxen -phenytoin -rifampin -thalidomide -warfarin This list may not describe all possible interactions. Give your health care provider a list of all the medicines, herbs, non-prescription drugs, or dietary supplements you use. Also tell them if you smoke, drink alcohol, or use illegal drugs. Some items may interact with your  medicine. What should I watch for while using this medicine? Your condition will be monitored carefully while you are receiving this medicine. If you are taking this medicine for a long time, carry an identification card with your name and address, the type and dose of your medicine, and your doctor's name and address. This medicine may increase your risk of getting an infection. Stay away from people who are sick. Tell your doctor or health care professional if you are around anyone with measles or chickenpox. Talk to your health care provider before you get any vaccines that you take this medicine. If you are going to have surgery, tell your doctor or health care professional that you have taken this medicine within the last twelve months. Ask your doctor or health care professional about your diet. You may need to lower the amount of salt you eat. The medicine can increase your blood sugar. If you are a diabetic check with your doctor if you need help adjusting the dose of your diabetic medicine. What side effects may I notice from receiving this medicine? Side effects that you should report to your doctor or health care professional as soon as possible: -allergic reactions like skin rash, itching or hives, swelling of the face, lips,  or tongue -black or tarry stools -change in the amount of urine -changes in vision -confusion, excitement, restlessness, a false sense of well-being -fever, sore throat, sneezing, cough, or other signs of infection, wounds that will not heal -hallucinations -increased thirst -mental depression, mood swings, mistaken feelings of self importance or of being mistreated -pain in hips, back, ribs, arms, shoulders, or legs -pain, redness, or irritation at the injection site -redness, blistering, peeling or loosening of the skin, including inside the mouth -rounding out of face -swelling of feet or lower legs -unusual bleeding or bruising -unusual tired or weak -wounds that do not heal Side effects that usually do not require medical attention (report to your doctor or health care professional if they continue or are bothersome): -diarrhea or constipation -change in taste -headache -nausea, vomiting -skin problems, acne, thin and shiny skin -touble sleeping -unusual growth of hair on the face or body -weight gain This list may not describe all possible side effects. Call your doctor for medical advice about side effects. You may report side effects to FDA at 1-800-FDA-1088. Where should I keep my medicine? This drug is given in a hospital or clinic and will not be stored at home. NOTE: This sheet is a summary. It may not cover all possible information. If you have questions about this medicine, talk to your doctor, pharmacist, or health care provider.  2015, Elsevier/Gold Standard. (2007-07-31 14:04:12)

## 2014-05-17 ENCOUNTER — Encounter (HOSPITAL_BASED_OUTPATIENT_CLINIC_OR_DEPARTMENT_OTHER): Payer: BC Managed Care – PPO

## 2014-05-17 DIAGNOSIS — C252 Malignant neoplasm of tail of pancreas: Secondary | ICD-10-CM

## 2014-05-17 NOTE — Progress Notes (Signed)
Chemo teaching done and consent signed for Oxaliplatin, 5FU, Leucovorin. Med calendar given to patient. Distress screening done.

## 2014-05-18 ENCOUNTER — Other Ambulatory Visit (HOSPITAL_COMMUNITY): Payer: Self-pay | Admitting: Hematology & Oncology

## 2014-05-18 ENCOUNTER — Telehealth (HOSPITAL_COMMUNITY): Payer: Self-pay | Admitting: *Deleted

## 2014-05-18 NOTE — Progress Notes (Signed)
Orders added to this encounter for labs. Labs linked to appts.

## 2014-05-18 NOTE — Telephone Encounter (Signed)
I called pt and left a vm letting patient know that she could stop taking her oral Iron tablet. Iron tablet has been causing extreme constipation resulting in only 1 BM per week. Patient has been talking stool softeners and occasional MAG CITRATE tablets to relieve constipation. Dr. Whitney Muse is going to check patient's ferritin and iron levels on Thursday with treatment. I explained to patient that we wanted her to start taking Senna S (which should have Colace in it) 2 tablets at bedtime daily. If the Senna S doesn't have Colace in it then she is to take 2 Senna tablets and 2 Colace tablets daily. I asked patient to call me back with any questions regarding this vm. Kurtis Bushman to follow up with patient on Thursday when pt is here for Folfox treatment.

## 2014-05-18 NOTE — Addendum Note (Signed)
Addended by: Gerhard Perches on: 05/18/2014 09:21 AM   Modules accepted: Orders

## 2014-05-20 ENCOUNTER — Encounter (HOSPITAL_BASED_OUTPATIENT_CLINIC_OR_DEPARTMENT_OTHER): Payer: BC Managed Care – PPO

## 2014-05-20 ENCOUNTER — Encounter (HOSPITAL_COMMUNITY): Payer: Self-pay

## 2014-05-20 ENCOUNTER — Ambulatory Visit (HOSPITAL_COMMUNITY): Payer: BC Managed Care – PPO

## 2014-05-20 DIAGNOSIS — C252 Malignant neoplasm of tail of pancreas: Secondary | ICD-10-CM

## 2014-05-20 DIAGNOSIS — I10 Essential (primary) hypertension: Secondary | ICD-10-CM | POA: Diagnosis not present

## 2014-05-20 DIAGNOSIS — Z5111 Encounter for antineoplastic chemotherapy: Secondary | ICD-10-CM

## 2014-05-20 DIAGNOSIS — I82401 Acute embolism and thrombosis of unspecified deep veins of right lower extremity: Secondary | ICD-10-CM | POA: Diagnosis not present

## 2014-05-20 DIAGNOSIS — F419 Anxiety disorder, unspecified: Secondary | ICD-10-CM | POA: Diagnosis not present

## 2014-05-20 DIAGNOSIS — C259 Malignant neoplasm of pancreas, unspecified: Secondary | ICD-10-CM | POA: Diagnosis present

## 2014-05-20 LAB — CBC WITH DIFFERENTIAL/PLATELET
BASOS PCT: 1 % (ref 0–1)
Basophils Absolute: 0.1 10*3/uL (ref 0.0–0.1)
Eosinophils Absolute: 0.4 10*3/uL (ref 0.0–0.7)
Eosinophils Relative: 4 % (ref 0–5)
HCT: 35.3 % — ABNORMAL LOW (ref 36.0–46.0)
HEMOGLOBIN: 11.1 g/dL — AB (ref 12.0–15.0)
LYMPHS ABS: 1.9 10*3/uL (ref 0.7–4.0)
Lymphocytes Relative: 20 % (ref 12–46)
MCH: 27.1 pg (ref 26.0–34.0)
MCHC: 31.4 g/dL (ref 30.0–36.0)
MCV: 86.1 fL (ref 78.0–100.0)
Monocytes Absolute: 0.8 10*3/uL (ref 0.1–1.0)
Monocytes Relative: 8 % (ref 3–12)
NEUTROS ABS: 6.5 10*3/uL (ref 1.7–7.7)
Neutrophils Relative %: 67 % (ref 43–77)
PLATELETS: 229 10*3/uL (ref 150–400)
RBC: 4.1 MIL/uL (ref 3.87–5.11)
RDW: 16.9 % — ABNORMAL HIGH (ref 11.5–15.5)
WBC: 9.7 10*3/uL (ref 4.0–10.5)

## 2014-05-20 LAB — IRON AND TIBC
Iron: 31 ug/dL — ABNORMAL LOW (ref 42–145)
Saturation Ratios: 10 % — ABNORMAL LOW (ref 20–55)
TIBC: 296 ug/dL (ref 250–470)
UIBC: 265 ug/dL (ref 125–400)

## 2014-05-20 LAB — COMPREHENSIVE METABOLIC PANEL
ALT: 41 U/L — AB (ref 0–35)
AST: 62 U/L — AB (ref 0–37)
Albumin: 2.8 g/dL — ABNORMAL LOW (ref 3.5–5.2)
Alkaline Phosphatase: 268 U/L — ABNORMAL HIGH (ref 39–117)
Anion gap: 6 (ref 5–15)
BILIRUBIN TOTAL: 0.6 mg/dL (ref 0.3–1.2)
BUN: 19 mg/dL (ref 6–23)
CHLORIDE: 106 mmol/L (ref 96–112)
CO2: 27 mmol/L (ref 19–32)
CREATININE: 0.81 mg/dL (ref 0.50–1.10)
Calcium: 8.7 mg/dL (ref 8.4–10.5)
GFR, EST AFRICAN AMERICAN: 77 mL/min — AB (ref >90)
GFR, EST NON AFRICAN AMERICAN: 67 mL/min — AB (ref >90)
Glucose, Bld: 150 mg/dL — ABNORMAL HIGH (ref 70–99)
Potassium: 3.7 mmol/L (ref 3.5–5.1)
Sodium: 139 mmol/L (ref 135–145)
TOTAL PROTEIN: 6.3 g/dL (ref 6.0–8.3)

## 2014-05-20 LAB — FERRITIN: Ferritin: 125 ng/mL (ref 10–291)

## 2014-05-20 MED ORDER — SODIUM CHLORIDE 0.9 % IJ SOLN
10.0000 mL | INTRAMUSCULAR | Status: DC | PRN
  Administered 2014-05-20: 10 mL
  Filled 2014-05-20: qty 10

## 2014-05-20 MED ORDER — SODIUM CHLORIDE 0.9 % IV SOLN
2400.0000 mg/m2 | INTRAVENOUS | Status: DC
  Administered 2014-05-20: 4000 mg via INTRAVENOUS
  Filled 2014-05-20: qty 80

## 2014-05-20 MED ORDER — DEXAMETHASONE SODIUM PHOSPHATE 10 MG/ML IJ SOLN: 10.0000 mg | Freq: Once | INTRAMUSCULAR | Status: DC

## 2014-05-20 MED ORDER — SODIUM CHLORIDE 0.9 % IV SOLN
Freq: Once | INTRAVENOUS | Status: AC
  Administered 2014-05-20: 8 mg via INTRAVENOUS
  Filled 2014-05-20: qty 4

## 2014-05-20 MED ORDER — FLUOROURACIL CHEMO INJECTION 2.5 GM/50ML
400.0000 mg/m2 | Freq: Once | INTRAVENOUS | Status: AC
  Administered 2014-05-20: 650 mg via INTRAVENOUS
  Filled 2014-05-20: qty 13

## 2014-05-20 MED ORDER — DEXTROSE 5 % IV SOLN
Freq: Once | INTRAVENOUS | Status: AC
  Administered 2014-05-20: 10:00:00 via INTRAVENOUS

## 2014-05-20 MED ORDER — OXALIPLATIN CHEMO INJECTION 100 MG/20ML
85.0000 mg/m2 | Freq: Once | INTRAVENOUS | Status: AC
  Administered 2014-05-20: 140 mg via INTRAVENOUS
  Filled 2014-05-20: qty 28

## 2014-05-20 MED ORDER — SODIUM CHLORIDE 0.9 % IV SOLN: 8.0000 mg | Freq: Once | INTRAVENOUS | Status: DC

## 2014-05-20 MED ORDER — HEPARIN SOD (PORK) LOCK FLUSH 100 UNIT/ML IV SOLN: 500.0000 [IU] | Freq: Once | INTRAVENOUS | Status: DC | PRN

## 2014-05-20 MED ORDER — LEUCOVORIN CALCIUM INJECTION 350 MG
400.0000 mg/m2 | Freq: Once | INTRAMUSCULAR | Status: AC
  Administered 2014-05-20: 668 mg via INTRAVENOUS
  Filled 2014-05-20: qty 33.4

## 2014-05-20 NOTE — Progress Notes (Signed)
Tolerated infusion well. continuous infusion pump teaching performed. Teach back method used and patient verbalized understanding.

## 2014-05-20 NOTE — Patient Instructions (Signed)
..  [facility] Discharge Instructions for Patients Receiving Chemotherapy  Today you received the following chemotherapy agents Oxaliplatin, 5FU, leucovorin Refer to What To Know After Chemo and your calendar provided by [facility]. We will talk with you tomorrow and follow-up on how you are doing after chemo and how your pain is. Take Mira-lax (glyco lax) 1 capful daily for stool softner. You can take one senna kot with this if needed.   If abdominal pain persists/worsens Dr. Whitney Muse will want to order x-ray to take a look at things.  If you develop nausea and vomiting, or diarrhea that is not controlled by your medication, call the clinic.  The clinic phone number is [phone]. Office hours are Monday-Friday 8:30am-5:00pm.  BELOW ARE SYMPTOMS THAT SHOULD BE REPORTED IMMEDIATELY:  *FEVER GREATER THAN 101.0 F  *CHILLS WITH OR WITHOUT FEVER  NAUSEA AND VOMITING THAT IS NOT CONTROLLED WITH YOUR NAUSEA MEDICATION  *UNUSUAL SHORTNESS OF BREATH  *UNUSUAL BRUISING OR BLEEDING  TENDERNESS IN MOUTH AND THROAT WITH OR WITHOUT PRESENCE OF ULCERS  *URINARY PROBLEMS  *BOWEL PROBLEMS  UNUSUAL RASH Items with * indicate a potential emergency and should be followed up as soon as possible. If you have an emergency after office hours please contact your primary care physician or go to the nearest emergency department.  Please call the clinic during office hours if you have any questions or concerns.   You may also contact the Patient Navigator at [phone] should you have any questions or need assistance in obtaining follow up care. _____________________________________________________________________ Have you asked about our STAR program?    STAR stands for Survivorship Training and Rehabilitation, and this is a nationally recognized cancer care program that focuses on survivorship and rehabilitation.  Cancer and cancer treatments may cause problems, such as, pain, making  you feel tired and keeping you from doing the things that you need or want to do. Cancer rehabilitation can help. Our goal is to reduce these troubling effects and help you have the best quality of life possible.  You may receive a survey from a nurse that asks questions about your current state of health.  Based on the survey results, all eligible patients will be referred to the [facility] program for an evaluation so we can better serve you! A frequently asked questions sheet is available upon request.

## 2014-05-21 ENCOUNTER — Telehealth (HOSPITAL_COMMUNITY): Payer: Self-pay | Admitting: *Deleted

## 2014-05-21 NOTE — Telephone Encounter (Signed)
Patient reports some tingling in hands when touching cool items, even room temperature. Denies nausea, no change in RLQ pain. No BM since 1/27. Taking one colace at night and is planning on starting mira-lax tomorrow.

## 2014-05-22 ENCOUNTER — Encounter (HOSPITAL_BASED_OUTPATIENT_CLINIC_OR_DEPARTMENT_OTHER): Payer: BC Managed Care – PPO

## 2014-05-22 DIAGNOSIS — Z452 Encounter for adjustment and management of vascular access device: Secondary | ICD-10-CM

## 2014-05-22 DIAGNOSIS — C252 Malignant neoplasm of tail of pancreas: Secondary | ICD-10-CM

## 2014-05-22 MED ORDER — HEPARIN SOD (PORK) LOCK FLUSH 100 UNIT/ML IV SOLN
500.0000 [IU] | Freq: Once | INTRAVENOUS | Status: AC | PRN
  Administered 2014-05-22: 500 [IU]

## 2014-05-22 MED ORDER — SODIUM CHLORIDE 0.9 % IJ SOLN
10.0000 mL | INTRAMUSCULAR | Status: DC | PRN
  Administered 2014-05-22: 10 mL
  Filled 2014-05-22: qty 10

## 2014-05-22 MED ORDER — HEPARIN SOD (PORK) LOCK FLUSH 100 UNIT/ML IV SOLN
INTRAVENOUS | Status: AC
  Filled 2014-05-22: qty 5

## 2014-05-22 NOTE — Progress Notes (Signed)
Milon Dikes presents to have home infusion pump d/c'd and for port-a-cath deaccess with flush.  Proper placement of portacath confirmed by CXR.  Portacath located right chest wall accessed with  H 20 needle.  Good blood return present. Portacath flushed with NS and 500U/3ml Heparin, and needle removed intact.  Procedure tolerated well and without incident.

## 2014-05-24 ENCOUNTER — Telehealth (HOSPITAL_COMMUNITY): Payer: Self-pay

## 2014-05-27 ENCOUNTER — Encounter (HOSPITAL_COMMUNITY): Payer: Self-pay | Admitting: Lab

## 2014-05-27 ENCOUNTER — Ambulatory Visit (HOSPITAL_COMMUNITY): Payer: BC Managed Care – PPO | Admitting: Hematology & Oncology

## 2014-05-27 ENCOUNTER — Encounter (HOSPITAL_COMMUNITY): Payer: Self-pay | Admitting: Hematology & Oncology

## 2014-05-27 ENCOUNTER — Encounter (HOSPITAL_COMMUNITY): Payer: BC Managed Care – PPO | Attending: Hematology & Oncology | Admitting: Hematology & Oncology

## 2014-05-27 VITALS — BP 110/55 | HR 98 | Temp 97.7°F | Resp 18 | Wt 135.7 lb

## 2014-05-27 DIAGNOSIS — I82401 Acute embolism and thrombosis of unspecified deep veins of right lower extremity: Secondary | ICD-10-CM | POA: Insufficient documentation

## 2014-05-27 DIAGNOSIS — Z7189 Other specified counseling: Secondary | ICD-10-CM

## 2014-05-27 DIAGNOSIS — I10 Essential (primary) hypertension: Secondary | ICD-10-CM | POA: Insufficient documentation

## 2014-05-27 DIAGNOSIS — C259 Malignant neoplasm of pancreas, unspecified: Secondary | ICD-10-CM | POA: Insufficient documentation

## 2014-05-27 DIAGNOSIS — C252 Malignant neoplasm of tail of pancreas: Secondary | ICD-10-CM

## 2014-05-27 DIAGNOSIS — F419 Anxiety disorder, unspecified: Secondary | ICD-10-CM | POA: Insufficient documentation

## 2014-05-27 NOTE — Progress Notes (Signed)
Referral made to Hospice/  Records faxed on 2/4

## 2014-05-27 NOTE — Patient Instructions (Signed)
[place] [facility]  Discharge Instructions:  Will make referral to Hospice. _______________________________________________________________  Thank you for choosing [facility] at [facility] to provide your oncology and hematology care.  To afford each patient quality time with our providers, please arrive at least 15 minutes before your scheduled appointment.  You need to re-schedule your appointment if you arrive 10 or more minutes late.  We strive to give you quality time with our providers, and arriving late affects you and other patients whose appointments are after yours.  Also, if you no show three or more times for appointments you may be dismissed from the clinic.  Again, thank you for choosing [facility] [place] hope is that these requests will allow you access to exceptional care and in a timely manner. _______________________________________________________________  If you have questions after your visit, please contact our office at [phone] between the hours of 8:30 a.m. and 5:00 p.m. Voicemails left after 4:30 p.m. will not be returned until the following business day. _______________________________________________________________  For prescription refill requests, have your pharmacy contact our office. _______________________________________________________________  Recommendations made by the consultant and any test results will be sent to your referring physician. _______________________________________________________________

## 2014-05-27 NOTE — Progress Notes (Signed)
[facility] CONSULT NOTE  Patient Care Team: Glenda Chroman, MD as PCP - General (Internal Medicine) Ladell Pier, MD as Consulting Physician (Oncology) Renda Rolls, MD as Referring Physician (Internal Medicine)  CHIEF COMPLAINTS/PURPOSE OF CONSULTATION:  Stage IV pancreatic cancer DVT  HISTORY OF PRESENTING ILLNESS:  Taylor Alvarez 81 y.o. female is here because of pancreatic cancer.   She attempted therapy with 5-FU and oxaliplatin. She states she has had no vomiting and no nausea but feels that generally her fatigue is worse. Her upper abdominal pain is becoming more constant. It is the same pain she had at diagnosis. She took one Vicodin yesterday. She has a lot of questions in regards to pain management. She describes her oral intake as declining, and she is not sure if this is from treatment for her disease alone. She feels it is getting harder to do the things that she likes to do.  She and her sons continue to work on end-of-life issues. She has a lot of questions in regards to hospice.    MEDICAL HISTORY:  Past Medical History  Diagnosis Date  . Celiac disease   . GERD (gastroesophageal reflux disease)   . Hypertension   . Anxiety   . History of fall 2014    at [facility] store  . Deep vein thrombosis (DVT) 03/12/2014  . Cancer 12/2013    pancreatic ca  . Pancreatic cancer     SURGICAL HISTORY: Past Surgical History  Procedure Laterality Date  . Urge incontinence    . Hypertension    . Back surgery    . Partial hysterectomy with bladder tac    . Bilateral cataract surgery    . Ovarian cyst removal    . Tubal ligation    . Cesarean section    . Colonoscopy  06/06/2011    Procedure: COLONOSCOPY;  Surgeon: Rogene Houston, MD;  Location: [facility] SUITE;  Service: Endoscopy;  Laterality: N/A;  . Interstim implant placement  04/30/13,05/12/13  . Wrist sprain Left   . Colonoscopy N/A 12/16/2013    Procedure: COLONOSCOPY;  Surgeon: Rogene Houston, MD;   Location: [facility];  Service: Endoscopy;  Laterality: N/A;  730-rescheduled 8/26 @ 100 Ann notified pt    SOCIAL HISTORY: History   Social History  . Marital Status: Divorced    Spouse Name: N/A    Number of Children: N/A  . Years of Education: N/A   Occupational History  . Not on file.   Social History Main Topics  . Smoking status: Former Smoker -- 0.50 packs/day for 5 years    Types: Cigarettes    Quit date: 06/05/1953  . Smokeless tobacco: Never Used  . Alcohol Use: No  . Drug Use: No  . Sexual Activity: Yes    Birth Control/ Protection: Post-menopausal   Other Topics Concern  . Not on file   Social History Narrative   Divorced-lives alone in house with #3 flights of steps   Has #3 sons and #6 grandchildren   Retired Psychologist, prison and probation services & taught in [employer], active in church, likes cooking and travel.   Lives in [place], [place]   Currently has medical student renting a room from her and has a renter in house behind her home.    FAMILY HISTORY: Family History  Problem Relation Age of Onset  . Anesthesia problems Neg Hx   . Hypotension Neg Hx   . Malignant hyperthermia Neg Hx   .  Pseudochol deficiency Neg Hx    indicated that her mother is deceased. She indicated that her father is deceased. She indicated that her sister is deceased. She indicated that her son is alive. She indicated that her other is alive.   ALLERGIES:  is allergic to cephalexin; clindamycin/lincomycin; nsaids; and cardizem.  MEDICATIONS:  Current Outpatient Prescriptions  Medication Sig Dispense Refill  . acetaminophen (TYLENOL) 500 MG tablet Take 500 mg by mouth every 6 (six) hours as needed. For pain    . aspirin EC 81 MG tablet Take 1 tablet (81 mg total) by mouth daily. 30 tablet 1  . atorvastatin (LIPITOR) 10 MG tablet Take 1 tablet (10 mg total) by mouth daily at 6 PM. 30 tablet 1  . brimonidine (ALPHAGAN) 0.15 % ophthalmic solution     . calcium carbonate (OS-CAL)  600 MG TABS Take 600 mg by mouth daily.     . Cholecalciferol (VITAMIN D) 2000 UNITS CAPS Take 2,000 Units by mouth daily.    Marland Kitchen dextrose 5 % SOLN 1,000 mL with fluorouracil 5 GM/100ML SOLN Inject into the vein. To be given every 14 days. To begin on Jan 28    . docusate sodium (COLACE) 100 MG capsule Take 100 mg by mouth at bedtime.     . enoxaparin (LOVENOX) 100 MG/ML injection INJECT 1ML INTO THE SKIN EVERY DAY 30 mL 0  . HYDROcodone-acetaminophen (NORCO/VICODIN) 5-325 MG per tablet Take 1 tablet by mouth every 6 (six) hours as needed for moderate pain. 100 tablet 0  . Lactobacillus (FLORAJEN ACIDOPHILUS) CAPS Take 1 capsule by mouth daily.     Marland Kitchen leucovorin 50 MG injection Inject into the vein daily. To be given every 14 days. To begin on Jan 28    . lidocaine-prilocaine (EMLA) cream Apply 1 application topically as needed. Apply to [facility] site 1-2 hours prior to stick and cover with plastic wrap 30 g 11  . MAGNESIUM CITRATE PO Take 400 mg by mouth daily as needed (for constipation).     . Multiple Vitamins-Minerals (WOMENS 50+ MULTI VITAMIN/MIN PO) Take 1 tablet by mouth daily.     . nitrofurantoin (MACRODANTIN) 100 MG capsule Take 100 mg by mouth every morning.     . Nutritional Supplements (ENSURE COMPLETE SHAKE) LIQD Take 1 Bottle by mouth 2 (two) times daily. 60 Bottle 5  . OXALIPLATIN IV Inject into the vein. To be given every 14 days. To begin on Jan 28    . prochlorperazine (COMPAZINE) 5 MG tablet Take 1-2 tablets (5-10 mg total) by mouth every 6 (six) hours as needed for nausea or vomiting. 60 tablet 1  . sennosides-docusate sodium (SENOKOT-S) 8.6-50 MG tablet Take 1 tablet by mouth daily.    . sertraline (ZOLOFT) 50 MG tablet Take 50 mg by mouth daily.      . ferrous sulfate 325 (65 FE) MG tablet Take 325 mg by mouth daily with breakfast.     No current facility-administered medications for this visit.   Facility-Administered Medications Ordered in Other Visits  Medication Dose Route  Frequency Provider Last Rate Last Dose  . 0.9 %  sodium chloride infusion   Intravenous Continuous D Rowe Robert, PA-C        Review of Systems  Constitutional: Positive for weight loss and malaise/fatigue.  HENT: Positive for sore throat.   Eyes: Negative.   Respiratory: Negative.   Cardiovascular: Negative.   Gastrointestinal: Positive for abdominal pain.  Genitourinary: Negative.   Musculoskeletal: Positive for back pain  and joint pain.  Neurological: Positive for weakness. Negative for dizziness, tingling, tremors, sensory change, speech change, focal weakness, seizures and loss of consciousness.  Endo/Heme/Allergies: Negative.   Psychiatric/Behavioral: Negative.     PHYSICAL EXAMINATION: ECOG PERFORMANCE STATUS: 1 - Symptomatic but completely ambulatory  Filed Vitals:   05/27/14 1212  BP: 110/55  Pulse: 98  Temp: 97.7 F (36.5 C)  Resp: 18   Filed Weights   05/27/14 1212  Weight: 135 lb 11.2 oz (61.553 kg)     Physical Exam  Constitutional: She is oriented to person, place, and time and well-developed, well-nourished, and in no distress.  Using walker, more fatigued in appearance  HENT:  Head: Normocephalic and atraumatic.  Nose: Nose normal.  Mouth/Throat: Oropharynx is clear and moist. No oropharyngeal exudate.  Eyes: Conjunctivae and EOM are normal. Pupils are equal, round, and reactive to light. Right eye exhibits no discharge. Left eye exhibits no discharge. No scleral icterus.  Neck: Normal range of motion. Neck supple. No tracheal deviation present. No thyromegaly present.  Cardiovascular: Normal rate, regular rhythm and normal heart sounds.  Exam reveals no gallop and no friction rub.   No murmur heard. Pulmonary/Chest: Effort normal and breath sounds normal. She has no wheezes. She has no rales.  Abdominal: Soft. Bowel sounds are normal. She exhibits no distension and no mass. There is no tenderness. There is no rebound and no guarding.  Abdominal wall  bruising noted from lovenox  Musculoskeletal: Normal range of motion. She exhibits no edema.  Lymphadenopathy:    She has no cervical adenopathy.  Neurological: She is alert and oriented to person, place, and time. She has normal reflexes. No cranial nerve deficit. Gait normal. Coordination normal.  Skin: Skin is warm and dry. No rash noted.  Psychiatric: Mood, memory, affect and judgment normal.  Nursing note and vitals reviewed.    LABORATORY DATA:  I have reviewed the data as listed Lab Results  Component Value Date   WBC 9.7 05/20/2014   HGB 11.1* 05/20/2014   HCT 35.3* 05/20/2014   MCV 86.1 05/20/2014   PLT 229 05/20/2014     Chemistry      Component Value Date/Time   NA 139 05/20/2014 0931   NA 140 04/09/2014 1001   K 3.7 05/20/2014 0931   K 3.8 04/09/2014 1001   CL 106 05/20/2014 0931   CO2 27 05/20/2014 0931   CO2 26 04/09/2014 1001   BUN 19 05/20/2014 0931   BUN 13.5 04/09/2014 1001   CREATININE 0.81 05/20/2014 0931   CREATININE 0.8 04/09/2014 1001   CREATININE 0.74 12/22/2013 1214      Component Value Date/Time   CALCIUM 8.7 05/20/2014 0931   CALCIUM 9.2 04/09/2014 1001   ALKPHOS 268* 05/20/2014 0931   ALKPHOS 276* 04/09/2014 1001   AST 62* 05/20/2014 0931   AST 66* 04/09/2014 1001   ALT 41* 05/20/2014 0931   ALT 73* 04/09/2014 1001   BILITOT 0.6 05/20/2014 0931   BILITOT 0.38 04/09/2014 1001       RADIOGRAPHIC STUDIES: I have personally reviewed the radiological images as listed and agreed with the findings in the report.  ASSESSMENT & PLAN:  Cancer of pancreas, tail Pleasant 81 year old female with stage IV adenocarcinoma of the pancreas. She progressed through Gemzar and Abraxane. She has been experiencing a slow clinical decline. She still is however fairly active. She is still self-sufficient. She tried treatment with 5-FU and oxaliplatin, she feels she had poor tolerance. She is not interested  in pursuing dose reductions nor modifications in  therapy. She is not interested in pursuing a clinical trial. She is not interested in pursuing single agent Xeloda or any other treatment. We have made a decision to  discontinue additional therapy.    Counseling regarding end of life decision making The patient and her son had multiple questions in regards to palliative care and hospice. We discussed the role of palliative care and the ultimate transition to hospice as she would clinically decline from her disease. She emphasized that her goal is quality and not quantity. I expressed to her that many patients with early intervention of good palliative care can live longer and better. She would like a referral to hospice. We will arrange this for her. She has continued to work on all of her end-of-life issues such as funeral arrangements with her children. Her major issue is pain and advised her to take her Vicodin every 4 hours. We will work with hospice on additional pain control if needed. I also discussed constipation in the setting of chronic pain medication use. We will work with hospice on this issue for her as well.    No orders of the defined types were placed in this encounter.    All questions were answered. The patient knows to call the clinic with any problems, questions or concerns.   Molli Hazard, MD MD 05/28/2014 8:40 AM

## 2014-05-28 ENCOUNTER — Encounter (HOSPITAL_COMMUNITY): Payer: Self-pay | Admitting: Hematology & Oncology

## 2014-05-28 DIAGNOSIS — Z7189 Other specified counseling: Secondary | ICD-10-CM | POA: Insufficient documentation

## 2014-05-28 NOTE — Assessment & Plan Note (Signed)
Pleasant 81 year old female with stage IV adenocarcinoma of the pancreas. She progressed through Gemzar and Abraxane. She has been experiencing a slow clinical decline. She still is however fairly active. She is still self-sufficient. She tried treatment with 5-FU and oxaliplatin, she feels she had poor tolerance. She is not interested in pursuing dose reductions nor modifications in therapy. She is not interested in pursuing a clinical trial. She is not interested in pursuing single agent Xeloda or any other treatment. We have made a decision to  discontinue additional therapy.

## 2014-05-28 NOTE — Assessment & Plan Note (Signed)
She is currently on Lovenox for DVT. She goes to her local pharmacy for administration and she does not feel comfortable self administering. I have advised her we can do additional teaching if she wishes here. She however likes to get out daily and is going to continue going to her local pharmacy. I did discuss other anticoagulation options. She currently feels comfortable with Lovenox.

## 2014-05-28 NOTE — Assessment & Plan Note (Signed)
The patient and her son had multiple questions in regards to palliative care and hospice. We discussed the role of palliative care and the ultimate transition to hospice as she would clinically decline from her disease. She emphasized that her goal is quality and not quantity. I expressed to her that many patients with early intervention of good palliative care can live longer and better. She would like a referral to hospice. We will arrange this for her. She has continued to work on all of her end-of-life issues such as funeral arrangements with her children. Her major issue is pain and advised her to take her Vicodin every 4 hours. We will work with hospice on additional pain control if needed. I also discussed constipation in the setting of chronic pain medication use. We will work with hospice on this issue for her as well.

## 2014-05-28 NOTE — Assessment & Plan Note (Signed)
Pleasant 81 year old female with stage IV adenocarcinoma of the pancreas. She has failed Gemzar and Abraxane with rise in her tumor marker and also declined clinically. She still however is quite independent with performance status of 1. CT scans performed on 04/08/2014 showed progressive disease in the liver, and also possibly early carcinomatosis. She is here today to discuss other treatment options. She does not want to travel for a clinical trial and also feels that traveling to [place] has become too difficult. X  We discussed the NCCN guidelines and additional options for therapy. Discussed single agent 5-FU, specifically Xeloda. We also addressed 5-FU and oxaliplatin combinations. I do think she is well enough to try additional therapy and we have opted for 5-FU oxaliplatin combination after a thorough discussion.  I spent some time addressing end-of-life issues with she and her son. They have discussed them somewhat but she still has a lot of planning to do. I have encouraged her to try to get all of her end-of-life issues in order now before she becomes more ill from her disease.  We will plan on chemotherapy teaching and hopefully starting her on therapy next week per her wishes. I have advised her to call with any questions problems or concerns prior to her follow-up. I will have her meet with [facility] or patient navigator today prior to leaving the clinic.

## 2014-06-01 ENCOUNTER — Other Ambulatory Visit: Payer: Self-pay | Admitting: Oncology

## 2014-06-02 ENCOUNTER — Ambulatory Visit (HOSPITAL_COMMUNITY): Payer: BC Managed Care – PPO | Admitting: Hematology & Oncology

## 2014-06-02 ENCOUNTER — Other Ambulatory Visit (HOSPITAL_COMMUNITY): Payer: Self-pay | Admitting: Oncology

## 2014-06-02 ENCOUNTER — Inpatient Hospital Stay (HOSPITAL_COMMUNITY): Payer: BC Managed Care – PPO

## 2014-06-02 DIAGNOSIS — I82401 Acute embolism and thrombosis of unspecified deep veins of right lower extremity: Secondary | ICD-10-CM

## 2014-06-02 DIAGNOSIS — Z7901 Long term (current) use of anticoagulants: Secondary | ICD-10-CM

## 2014-06-02 MED ORDER — ENOXAPARIN SODIUM 100 MG/ML ~~LOC~~ SOLN: 100.0000 mg | SUBCUTANEOUS | Status: AC

## 2014-06-04 ENCOUNTER — Encounter (HOSPITAL_COMMUNITY): Payer: BC Managed Care – PPO

## 2014-06-15 ENCOUNTER — Inpatient Hospital Stay (HOSPITAL_COMMUNITY): Payer: BC Managed Care – PPO

## 2014-06-15 ENCOUNTER — Ambulatory Visit (HOSPITAL_COMMUNITY): Payer: BC Managed Care – PPO | Admitting: Hematology & Oncology

## 2014-06-17 ENCOUNTER — Encounter (HOSPITAL_COMMUNITY): Payer: BC Managed Care – PPO

## 2014-06-29 ENCOUNTER — Inpatient Hospital Stay (HOSPITAL_COMMUNITY): Payer: BC Managed Care – PPO

## 2014-06-29 ENCOUNTER — Ambulatory Visit (HOSPITAL_COMMUNITY): Payer: BC Managed Care – PPO | Admitting: Hematology & Oncology

## 2014-07-01 ENCOUNTER — Encounter (HOSPITAL_COMMUNITY): Payer: BC Managed Care – PPO

## 2014-07-13 ENCOUNTER — Ambulatory Visit (HOSPITAL_COMMUNITY): Payer: BC Managed Care – PPO | Admitting: Hematology & Oncology

## 2014-07-13 ENCOUNTER — Inpatient Hospital Stay (HOSPITAL_COMMUNITY): Payer: BC Managed Care – PPO

## 2014-07-15 ENCOUNTER — Encounter (HOSPITAL_COMMUNITY): Payer: BC Managed Care – PPO

## 2014-08-22 DEATH — deceased

## 2014-12-20 ENCOUNTER — Ambulatory Visit (INDEPENDENT_AMBULATORY_CARE_PROVIDER_SITE_OTHER): Payer: Medicare Other | Admitting: Internal Medicine

## 2015-04-19 ENCOUNTER — Other Ambulatory Visit: Payer: Self-pay | Admitting: Nurse Practitioner

## 2015-07-22 IMAGING — CT CT ABD-PELV W/ CM
2 of 4 series · 12 of 36 positions shown, 15 images · IV contrast (omnipaque)
Comparison: CT abdomen 03/18/2012, CT chest 11/02/2007

CLINICAL DATA: Abdominal pain, abnormal colonoscopy with
biopsy-proven metastatic adenocarcinoma, past history celiac
disease, hypertension, GERD, former smoker

EXAM:
CT CHEST, ABDOMEN, AND PELVIS WITH CONTRAST
TECHNIQUE: Multidetector CT imaging of the chest, abdomen and pelvis was
performed following the standard protocol during bolus
administration of intravenous contrast. Sagittal and coronal MPR
images reconstructed from axial data set.
CONTRAST:  100mL OMNIPAQUE IOHEXOL 300 MG/ML SOLN IV. Dilute oral
contrast.

[Series 2: cap with 5.0 b40f · axial · 0.68mm/px · z∈[-534,-64]mm · 9 of 118 slices shown, 12 images]
[im 12/118  mediastinal]
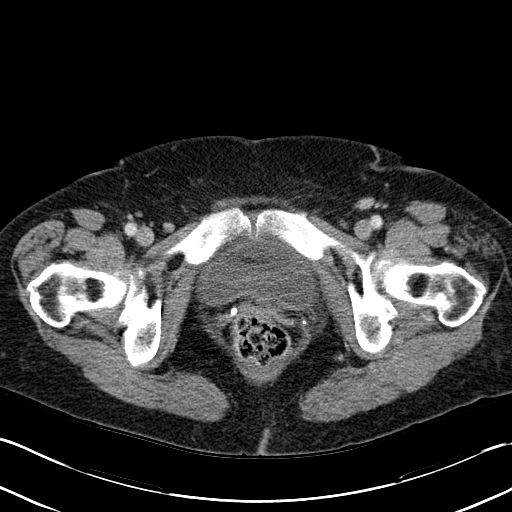
[im 12/118  lung]
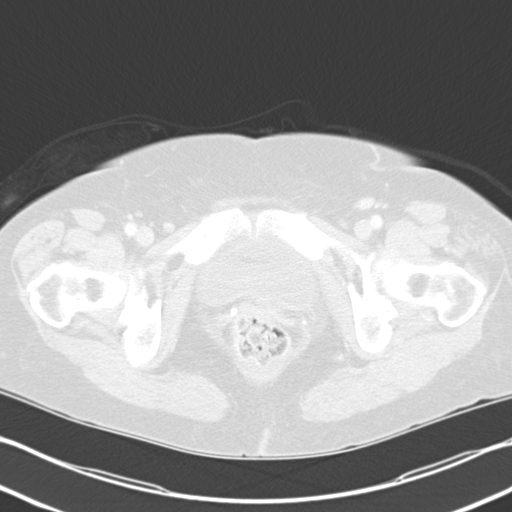
[im 24/118  lung]
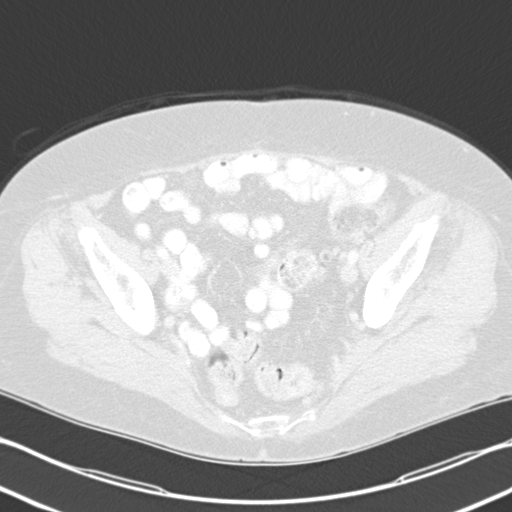
[im 36/118  lung]
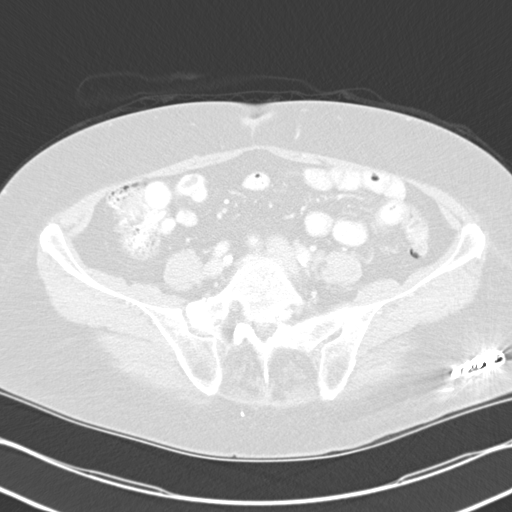
[im 47/118  lung]
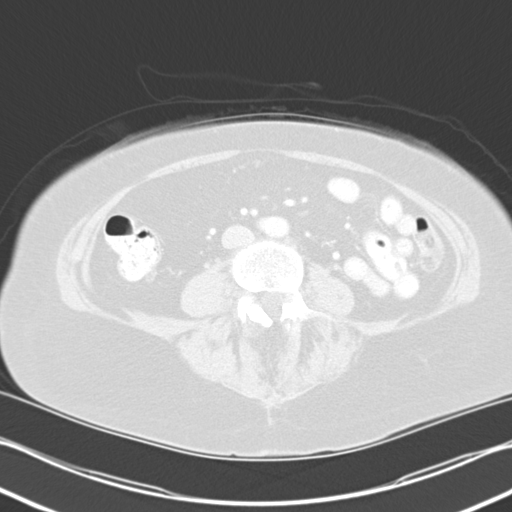
[im 59/118  mediastinal]
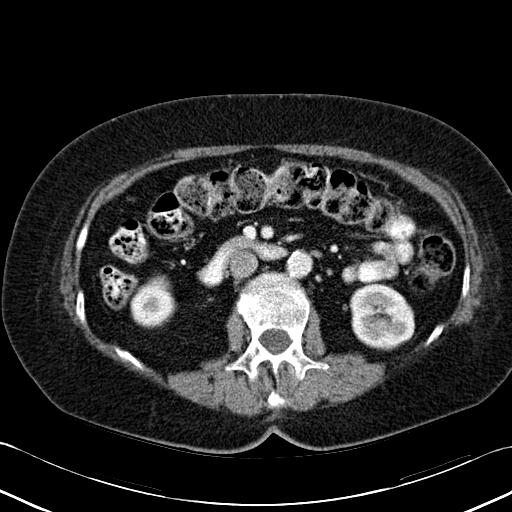
[im 59/118  lung]
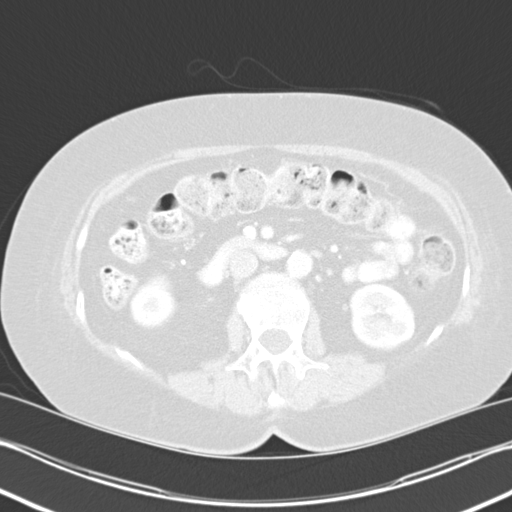
[im 71/118  lung]
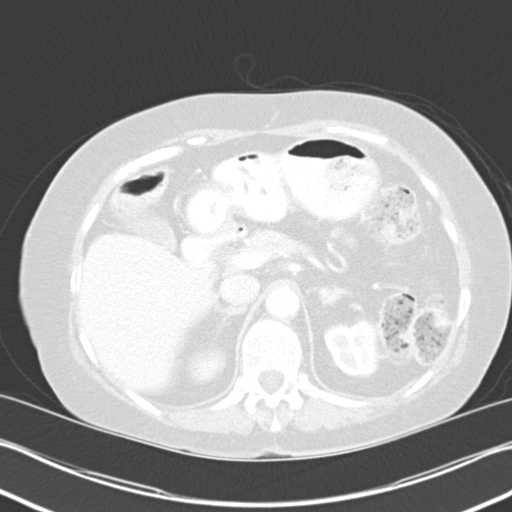
[im 82/118  lung]
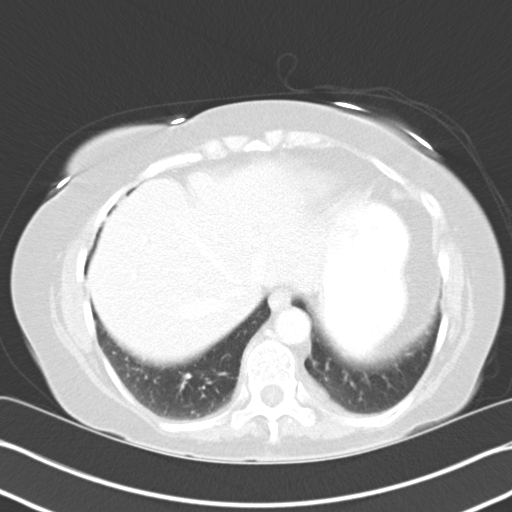
[im 94/118  lung]
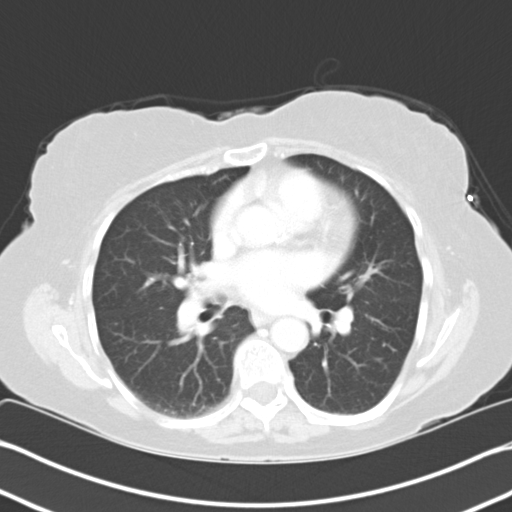
[im 106/118  mediastinal]
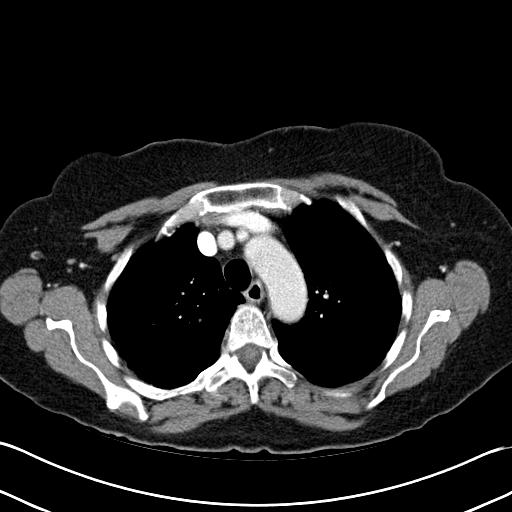
[im 106/118  lung]
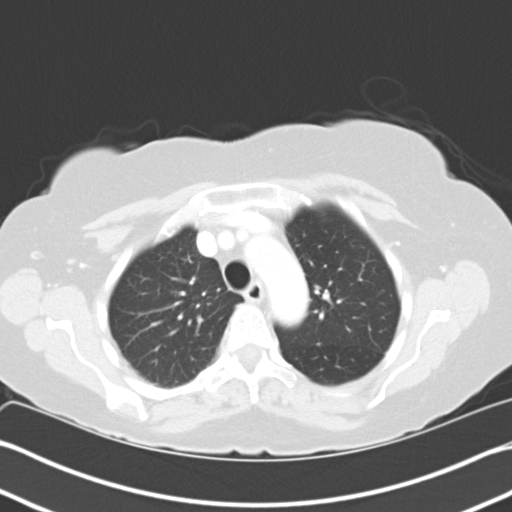

[Series 3: mpr cor post contrast (id) · coronal · 0.69mm/px · 3 of 74 slices shown]
[im 15/74  lung]
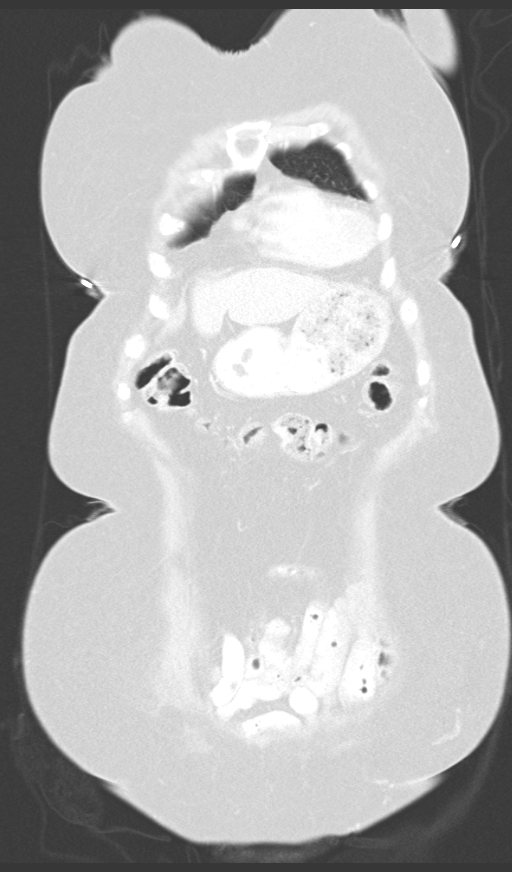
[im 30/74  lung]
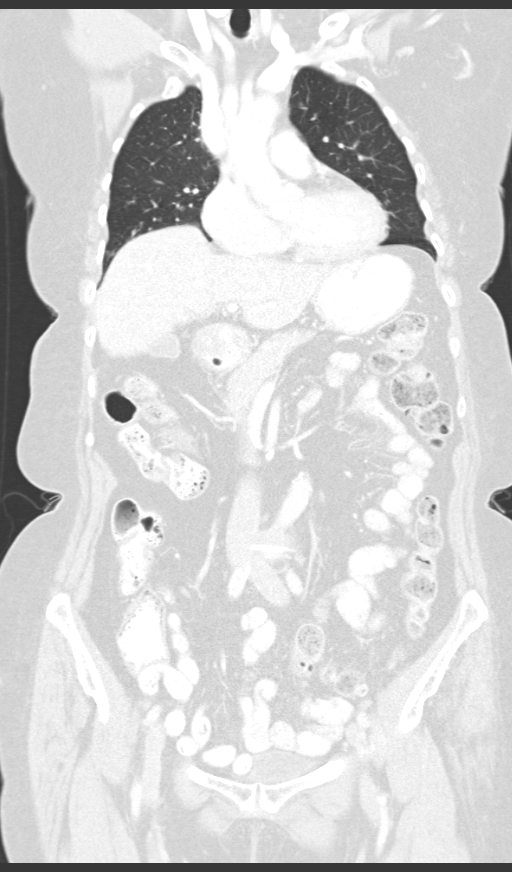
[im 44/74  lung]
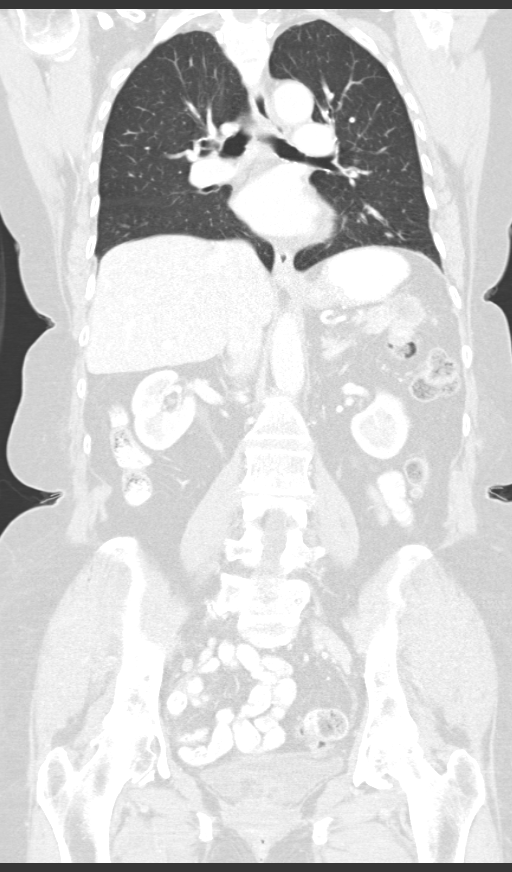

[12 of 36 positions shown; findings below may reference images not displayed]

FINDINGS: CT CHEST FINDINGS

Mild scattered atherosclerotic calcification aorta, great vessels,
and LEFT coronary artery.

Vascular structures patent on nondedicated exam.

No thoracic adenopathy.

Low-attenuation LEFT paraspinal mass at T10-T11, 2.0 x 1.5 x 1.7 cm,
little changed.

Minimal nonspecific ground-glass infiltrate lateral base of RIGHT
middle lobe images 33-35.

Lungs otherwise clear without additional infiltrate, pleural
effusion, pneumothorax or nodule.

Asymmetric thyroid lobes larger on RIGHT, incompletely visualized,
question posterior nodule on RIGHT 2.4 cm diameter.

No thoracic osseous findings.

CT ABDOMEN AND PELVIS FINDINGS

Multiple low-attenuation foci within the liver, poorly defined,
highly suspicious for metastatic disease measuring up to 14 x 12 mm
RIGHT lobe image 42. Irregular per heterogeneous predominately
low-attenuation mass at tail of pancreas, 4.9 x 2.4 x 3.0 cm, high
suspicious for a pancreatic tail neoplasm.

Mass extends into the medial margin of the spleenand into the
splenic flexure of the colon.

Small calcification within lesion likely represents an occluded
splenic artery, with a new spot collateral noted in the LEFT upper
quadrant image 44.

Additional questionable soft tissue nodule versus area of wall
irregularity at the distal transverse colon 7 mm diameter image 51.

Kidneys and adrenal glands normal appearance.

Stomach and bowel loops otherwise normal without evidence of bowel
obstruction.

Uterus surgically absent with unremarkable bladder and ureters.

Appendix nonvisualized.

No mass, adenopathy, free fluid, or free air.

Prior lumbar spine surgery with neural stimulator lead in RIGHT
pelvis.

Retrolisthesis at L3-L4 and anterolisthesis at L4-L5.

Question disc herniation at L3-L4 LEFT of midline.
IMPRESSION: Irregular heterogeneous predominant low-attenuation mass at tail of
pancreas invading the splenic flexure of the colon and suspect a
medial margin of the spleen consistent with a pancreatic neoplasm.

Multiple hepatic metastases.

Questionable additional 7 mm nodule versus artifact at distal
transverse colon.

Minimal nonspecific ground-glass infiltrate at base of RIGHT middle
lobe laterally, recommend attention on followup imaging.

Asymmetric enlargement of RIGHT thyroid lobe recommend follow-up
ultrasound to exclude mass.

Stable appearance of a LEFT paraspinal low-attenuation mass at
T10-T11, stability of which since 1665 suggests a benign process,
question neurenteric cyst, Tarlov cyst, low-attenuation tumor such
as neurofibroma, or lateral thoracic meningocele.

## 2015-09-08 IMAGING — US IR FLUORO GUIDE CV LINE*R*
1 series · 1 of 1 positions shown · non-contrast
Comparison: none

CLINICAL DATA: Pancreatic carcinoma, needs venous access for
chemotherapy
TECHNIQUE: The procedure, risks, benefits, and alternatives were explained to
the patient. Questions regarding the procedure were encouraged and
answered. The patient understands and consents to the procedure. As
antibiotic prophylaxis, vancomycin 1 g was ordered pre-procedure and
administered intravenously within one hour of incision. Patency of
the right IJ vein was confirmed with ultrasound with image
documentation. An appropriate skin site was determined. Skin site
was marked. Region was prepped using maximum barrier technique
including cap and mask, sterile gown, sterile gloves, large sterile
sheet, and Chlorhexidine as cutaneous antisepsis. The region was
infiltrated locally with 1% lidocaine. Under real-time ultrasound
guidance, the right IJ vein was accessed with a 21 gauge
micropuncture needle; the needle tip within the vein was confirmed
with ultrasound image documentation. Needle was exchanged over a 018
guidewire for transitional dilator which allowed passage of the
Benson wire into the IVC. Over this, the transitional dilator was
exchanged for a 5 French MPA catheter. A small incision was made on
the right anterior chest wall and a subcutaneous pocket fashioned.
The power-injectable port was positioned and its catheter tunneled
to the right IJ dermatotomy site. The MPA catheter was exchanged
over an Amplatz wire for a peel-away sheath, through which the port
catheter, which had been trimmed to the appropriate length, was
advanced and positioned under fluoroscopy with its tip at the
cavoatrial junction. Spot chest radiograph confirms good catheter
position and no pneumothorax. The pocket was closed with deep
interrupted and subcuticular continuous 3-0 Monocryl sutures. The
port was flushed per protocol. The incisions were covered with
Dermabond then covered with a sterile dressing.

COMPLICATIONS:
COMPLICATIONS
None immediate

[Series 1: ir us guide vasc access*right* · 1 of 1 slices shown]
[im 1/1]
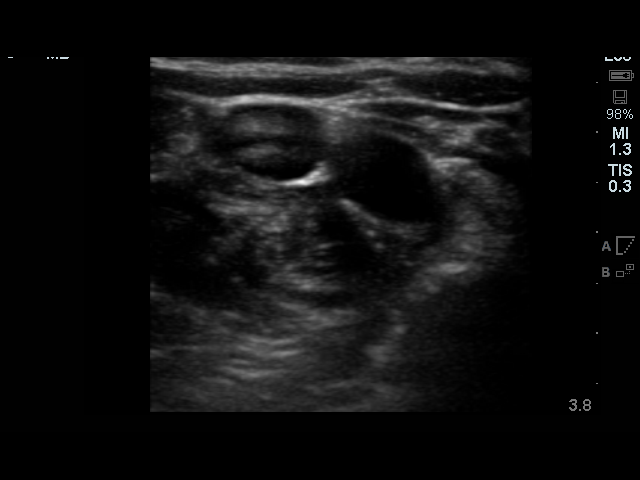

[1 of 1 positions shown; findings below may reference images not displayed]

EXAM:
TUNNELED PORT CATHETER PLACEMENT WITH ULTRASOUND AND FLUOROSCOPIC
GUIDANCE

FLUOROSCOPY TIME:  Eighteen seconds

ANESTHESIA/SEDATION:
Intravenous Fentanyl and Versed were administered as conscious
sedation during continuous cardiorespiratory monitoring by the
radiology RN, with a total moderate sedation time of 21 minutes.
IMPRESSION: Technically successful right IJ power-injectable port catheter
placement. Ready for routine use.
